# Patient Record
Sex: Female | Born: 1990 | Race: Black or African American | Hispanic: No | Marital: Married | Smoking: Current every day smoker
Health system: Southern US, Community
[De-identification: ages and names within clinical notes are randomized; demographics above are authoritative.]

## PROBLEM LIST (undated history)

## (undated) DIAGNOSIS — R87619 Unspecified abnormal cytological findings in specimens from cervix uteri: Secondary | ICD-10-CM

## (undated) DIAGNOSIS — A749 Chlamydial infection, unspecified: Secondary | ICD-10-CM

## (undated) DIAGNOSIS — R51 Headache: Secondary | ICD-10-CM

## (undated) DIAGNOSIS — B009 Herpesviral infection, unspecified: Secondary | ICD-10-CM

## (undated) DIAGNOSIS — K649 Unspecified hemorrhoids: Secondary | ICD-10-CM

## (undated) DIAGNOSIS — IMO0002 Reserved for concepts with insufficient information to code with codable children: Secondary | ICD-10-CM

## (undated) HISTORY — DX: Unspecified abnormal cytological findings in specimens from cervix uteri: R87.619

## (undated) HISTORY — DX: Unspecified hemorrhoids: K64.9

## (undated) HISTORY — PX: CHOLECYSTECTOMY: SHX55

## (undated) HISTORY — DX: Chlamydial infection, unspecified: A74.9

## (undated) HISTORY — PX: DILATION AND CURETTAGE OF UTERUS: SHX78

## (undated) HISTORY — DX: Reserved for concepts with insufficient information to code with codable children: IMO0002

## (undated) HISTORY — DX: Headache: R51

## (undated) HISTORY — DX: Herpesviral infection, unspecified: B00.9

---
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
## 2008-02-29 ENCOUNTER — Emergency Department (HOSPITAL_COMMUNITY): Admission: EM | Admit: 2008-02-29 | Discharge: 2008-02-29 | Payer: Self-pay | Admitting: Emergency Medicine

## 2008-06-09 ENCOUNTER — Emergency Department (HOSPITAL_COMMUNITY): Admission: EM | Admit: 2008-06-09 | Discharge: 2008-06-09 | Payer: Self-pay | Admitting: Emergency Medicine

## 2008-07-22 ENCOUNTER — Emergency Department (HOSPITAL_COMMUNITY): Admission: EM | Admit: 2008-07-22 | Discharge: 2008-07-23 | Payer: Self-pay | Admitting: Emergency Medicine

## 2008-08-28 ENCOUNTER — Other Ambulatory Visit: Admission: RE | Admit: 2008-08-28 | Discharge: 2008-08-28 | Payer: Self-pay | Admitting: Obstetrics and Gynecology

## 2008-08-31 ENCOUNTER — Emergency Department (HOSPITAL_COMMUNITY): Admission: EM | Admit: 2008-08-31 | Discharge: 2008-09-01 | Payer: Self-pay | Admitting: Emergency Medicine

## 2008-10-10 ENCOUNTER — Emergency Department (HOSPITAL_COMMUNITY): Admission: EM | Admit: 2008-10-10 | Discharge: 2008-10-10 | Payer: Self-pay | Admitting: Emergency Medicine

## 2008-10-11 ENCOUNTER — Emergency Department (HOSPITAL_COMMUNITY): Admission: EM | Admit: 2008-10-11 | Discharge: 2008-10-11 | Payer: Self-pay | Admitting: Family Medicine

## 2009-01-08 ENCOUNTER — Ambulatory Visit (HOSPITAL_COMMUNITY): Admission: RE | Admit: 2009-01-08 | Discharge: 2009-01-08 | Payer: Self-pay | Admitting: Obstetrics & Gynecology

## 2009-01-16 ENCOUNTER — Emergency Department (HOSPITAL_COMMUNITY): Admission: EM | Admit: 2009-01-16 | Discharge: 2009-01-16 | Payer: Self-pay | Admitting: Emergency Medicine

## 2009-01-22 ENCOUNTER — Ambulatory Visit (HOSPITAL_COMMUNITY): Admission: RE | Admit: 2009-01-22 | Discharge: 2009-01-22 | Payer: Self-pay | Admitting: Obstetrics & Gynecology

## 2009-02-23 ENCOUNTER — Inpatient Hospital Stay (HOSPITAL_COMMUNITY): Admission: AD | Admit: 2009-02-23 | Discharge: 2009-02-23 | Payer: Self-pay | Admitting: Obstetrics & Gynecology

## 2009-03-05 ENCOUNTER — Inpatient Hospital Stay (HOSPITAL_COMMUNITY): Admission: AD | Admit: 2009-03-05 | Discharge: 2009-03-07 | Payer: Self-pay | Admitting: Family Medicine

## 2009-03-05 ENCOUNTER — Ambulatory Visit: Payer: Self-pay | Admitting: Advanced Practice Midwife

## 2009-05-05 ENCOUNTER — Emergency Department (HOSPITAL_COMMUNITY): Admission: EM | Admit: 2009-05-05 | Discharge: 2009-05-05 | Payer: Self-pay | Admitting: Emergency Medicine

## 2009-06-30 ENCOUNTER — Emergency Department (HOSPITAL_COMMUNITY): Admission: EM | Admit: 2009-06-30 | Discharge: 2009-06-30 | Payer: Self-pay | Admitting: Emergency Medicine

## 2009-10-09 ENCOUNTER — Emergency Department (HOSPITAL_COMMUNITY): Admission: EM | Admit: 2009-10-09 | Discharge: 2009-10-09 | Payer: Self-pay | Admitting: Emergency Medicine

## 2009-10-25 ENCOUNTER — Inpatient Hospital Stay (HOSPITAL_COMMUNITY): Admission: EM | Admit: 2009-10-25 | Discharge: 2009-10-27 | Payer: Self-pay | Admitting: Emergency Medicine

## 2010-05-03 ENCOUNTER — Emergency Department (HOSPITAL_COMMUNITY): Admission: EM | Admit: 2010-05-03 | Discharge: 2010-05-04 | Payer: Self-pay | Admitting: Emergency Medicine

## 2010-08-09 ENCOUNTER — Emergency Department (HOSPITAL_COMMUNITY): Admission: EM | Admit: 2010-08-09 | Discharge: 2010-08-09 | Payer: Self-pay | Admitting: Emergency Medicine

## 2010-09-23 ENCOUNTER — Emergency Department (HOSPITAL_COMMUNITY): Admission: EM | Admit: 2010-09-23 | Discharge: 2010-09-23 | Payer: Self-pay | Admitting: Emergency Medicine

## 2010-11-01 ENCOUNTER — Emergency Department (HOSPITAL_COMMUNITY): Admission: EM | Admit: 2010-11-01 | Discharge: 2010-11-01 | Payer: Self-pay | Admitting: Emergency Medicine

## 2010-12-21 ENCOUNTER — Emergency Department (HOSPITAL_COMMUNITY)
Admission: EM | Admit: 2010-12-21 | Discharge: 2010-12-21 | Payer: Self-pay | Source: Home / Self Care | Admitting: Emergency Medicine

## 2011-01-28 ENCOUNTER — Emergency Department (HOSPITAL_COMMUNITY)
Admission: EM | Admit: 2011-01-28 | Discharge: 2011-01-28 | Disposition: A | Discharge status: Home / Self Care | Payer: Self-pay | Attending: Emergency Medicine | Admitting: Emergency Medicine

## 2011-01-28 DIAGNOSIS — F341 Dysthymic disorder: Secondary | ICD-10-CM | POA: Insufficient documentation

## 2011-01-28 DIAGNOSIS — R635 Abnormal weight gain: Secondary | ICD-10-CM | POA: Insufficient documentation

## 2011-01-28 DIAGNOSIS — R110 Nausea: Secondary | ICD-10-CM | POA: Insufficient documentation

## 2011-01-28 DIAGNOSIS — R42 Dizziness and giddiness: Secondary | ICD-10-CM | POA: Insufficient documentation

## 2011-01-28 LAB — GLUCOSE, CAPILLARY: Glucose-Capillary: 81 mg/dL (ref 70–99)

## 2011-01-28 LAB — POCT PREGNANCY, URINE: Preg Test, Ur: NEGATIVE

## 2011-03-08 LAB — URINALYSIS, ROUTINE W REFLEX MICROSCOPIC
Bilirubin Urine: NEGATIVE
Glucose, UA: NEGATIVE mg/dL
Hgb urine dipstick: NEGATIVE
Ketones, ur: NEGATIVE mg/dL
Nitrite: NEGATIVE
Protein, ur: NEGATIVE mg/dL
Specific Gravity, Urine: 1.02 (ref 1.005–1.030)
Urobilinogen, UA: 0.2 mg/dL (ref 0.0–1.0)
pH: 7 (ref 5.0–8.0)

## 2011-03-08 LAB — DIFFERENTIAL
Basophils Absolute: 0 10*3/uL (ref 0.0–0.1)
Basophils Relative: 1 % (ref 0–1)
Eosinophils Absolute: 0.5 10*3/uL (ref 0.0–0.7)
Eosinophils Relative: 7 % — ABNORMAL HIGH (ref 0–5)
Lymphocytes Relative: 38 % (ref 12–46)
Lymphs Abs: 2.6 10*3/uL (ref 0.7–4.0)
Monocytes Absolute: 0.5 10*3/uL (ref 0.1–1.0)
Monocytes Relative: 7 % (ref 3–12)
Neutro Abs: 3.3 10*3/uL (ref 1.7–7.7)
Neutrophils Relative %: 48 % (ref 43–77)

## 2011-03-08 LAB — CBC
HCT: 38.5 % (ref 36.0–46.0)
Hemoglobin: 13.1 g/dL (ref 12.0–15.0)
MCH: 29.6 pg (ref 26.0–34.0)
MCHC: 34.1 g/dL (ref 30.0–36.0)
MCV: 87 fL (ref 78.0–100.0)
Platelets: 272 10*3/uL (ref 150–400)
RBC: 4.42 MIL/uL (ref 3.87–5.11)
RDW: 14.8 % (ref 11.5–15.5)
WBC: 6.9 10*3/uL (ref 4.0–10.5)

## 2011-03-08 LAB — BASIC METABOLIC PANEL
BUN: 11 mg/dL (ref 6–23)
CO2: 23 mEq/L (ref 19–32)
Calcium: 8.6 mg/dL (ref 8.4–10.5)
Chloride: 110 mEq/L (ref 96–112)
Creatinine, Ser: 0.71 mg/dL (ref 0.4–1.2)
GFR calc Af Amer: >60 mL/min (ref >60)
GFR calc non Af Amer: >60 mL/min (ref >60)
Glucose, Bld: 75 mg/dL (ref 70–99)
Potassium: 3.9 mEq/L (ref 3.5–5.1)
Sodium: 137 mEq/L (ref 135–145)

## 2011-03-08 LAB — HEPATIC FUNCTION PANEL
ALT: 12 U/L (ref 0–35)
AST: 16 U/L (ref 0–37)
Albumin: 3.6 g/dL (ref 3.5–5.2)
Alkaline Phosphatase: 68 U/L (ref 39–117)
Bilirubin, Direct: 0.1 mg/dL (ref 0.0–0.3)
Indirect Bilirubin: 0.3 mg/dL (ref 0.3–0.9)
Total Bilirubin: 0.4 mg/dL (ref 0.3–1.2)
Total Protein: 6.9 g/dL (ref 6.0–8.3)

## 2011-03-08 LAB — LIPASE, BLOOD: Lipase: 24 U/L (ref 11–59)

## 2011-03-08 LAB — POCT PREGNANCY, URINE: Preg Test, Ur: NEGATIVE

## 2011-03-10 ENCOUNTER — Emergency Department (HOSPITAL_COMMUNITY)
Admission: EM | Admit: 2011-03-10 | Discharge: 2011-03-10 | Disposition: A | Discharge status: Home / Self Care | Payer: Medicaid Other | Attending: Emergency Medicine | Admitting: Emergency Medicine

## 2011-03-10 DIAGNOSIS — J069 Acute upper respiratory infection, unspecified: Secondary | ICD-10-CM | POA: Insufficient documentation

## 2011-03-10 LAB — RAPID STREP SCREEN (MED CTR MEBANE ONLY): Streptococcus, Group A Screen (Direct): NEGATIVE

## 2011-03-17 ENCOUNTER — Emergency Department (HOSPITAL_COMMUNITY)
Admission: EM | Admit: 2011-03-17 | Discharge: 2011-03-17 | Disposition: A | Discharge status: Home / Self Care | Payer: Medicaid Other | Attending: Emergency Medicine | Admitting: Emergency Medicine

## 2011-03-17 DIAGNOSIS — G8929 Other chronic pain: Secondary | ICD-10-CM | POA: Insufficient documentation

## 2011-03-17 DIAGNOSIS — E669 Obesity, unspecified: Secondary | ICD-10-CM | POA: Insufficient documentation

## 2011-03-17 DIAGNOSIS — Z0389 Encounter for observation for other suspected diseases and conditions ruled out: Secondary | ICD-10-CM | POA: Insufficient documentation

## 2011-03-23 ENCOUNTER — Emergency Department (HOSPITAL_COMMUNITY)
Admission: EM | Admit: 2011-03-23 | Discharge: 2011-03-23 | Disposition: A | Discharge status: Home / Self Care | Payer: Medicaid Other | Attending: Emergency Medicine | Admitting: Emergency Medicine

## 2011-03-23 DIAGNOSIS — F341 Dysthymic disorder: Secondary | ICD-10-CM | POA: Insufficient documentation

## 2011-03-23 DIAGNOSIS — L988 Other specified disorders of the skin and subcutaneous tissue: Secondary | ICD-10-CM | POA: Insufficient documentation

## 2011-03-23 DIAGNOSIS — L089 Local infection of the skin and subcutaneous tissue, unspecified: Secondary | ICD-10-CM | POA: Insufficient documentation

## 2011-03-23 LAB — POCT PREGNANCY, URINE: Preg Test, Ur: NEGATIVE

## 2011-03-30 LAB — BASIC METABOLIC PANEL
BUN: 5 mg/dL — ABNORMAL LOW (ref 6–23)
CO2: 26 mEq/L (ref 19–32)
Calcium: 8.7 mg/dL (ref 8.4–10.5)
Chloride: 111 mEq/L (ref 96–112)
Creatinine, Ser: 0.64 mg/dL (ref 0.4–1.2)
GFR calc Af Amer: >60 mL/min (ref >60)
GFR calc non Af Amer: >60 mL/min (ref >60)
Glucose, Bld: 92 mg/dL (ref 70–99)
Potassium: 4 mEq/L (ref 3.5–5.1)
Sodium: 141 mEq/L (ref 135–145)

## 2011-03-30 LAB — HEPATIC FUNCTION PANEL
AST: 15 U/L (ref 0–37)
Albumin: 3 g/dL — ABNORMAL LOW (ref 3.5–5.2)
Alkaline Phosphatase: 72 U/L (ref 39–117)
Bilirubin, Direct: 0.1 mg/dL (ref 0.0–0.3)
Total Bilirubin: 0.5 mg/dL (ref 0.3–1.2)

## 2011-03-30 LAB — DIFFERENTIAL
Basophils Relative: 1 % (ref 0–1)
Eosinophils Relative: 7 % — ABNORMAL HIGH (ref 0–5)
Monocytes Absolute: 0.3 10*3/uL (ref 0.1–1.0)
Monocytes Relative: 6 % (ref 3–12)
Neutro Abs: 1.6 10*3/uL — ABNORMAL LOW (ref 1.7–7.7)

## 2011-03-30 LAB — CBC
HCT: 33.5 % — ABNORMAL LOW (ref 36.0–46.0)
Hemoglobin: 11.3 g/dL — ABNORMAL LOW (ref 12.0–15.0)
MCHC: 33.7 g/dL (ref 30.0–36.0)
MCV: 85.7 fL (ref 78.0–100.0)
Platelets: 266 10*3/uL (ref 150–400)
RBC: 3.9 MIL/uL (ref 3.87–5.11)
RDW: 15.3 % (ref 11.5–15.5)
WBC: 4.8 10*3/uL (ref 4.0–10.5)

## 2011-03-31 LAB — ACETAMINOPHEN LEVEL: Acetaminophen (Tylenol), Serum: <10 ug/mL — ABNORMAL LOW (ref 10–30)

## 2011-03-31 LAB — BASIC METABOLIC PANEL
BUN: 5 mg/dL — ABNORMAL LOW (ref 6–23)
Calcium: 8.7 mg/dL (ref 8.4–10.5)
Creatinine, Ser: 0.72 mg/dL (ref 0.4–1.2)
GFR calc Af Amer: >60 mL/min (ref >60)
GFR calc non Af Amer: >60 mL/min (ref >60)

## 2011-03-31 LAB — URINALYSIS, ROUTINE W REFLEX MICROSCOPIC
Glucose, UA: NEGATIVE mg/dL
Hgb urine dipstick: NEGATIVE
Protein, ur: NEGATIVE mg/dL
Specific Gravity, Urine: 1.02 (ref 1.005–1.030)

## 2011-03-31 LAB — BLOOD GAS, ARTERIAL
Bicarbonate: 23.8 mEq/L (ref 20.0–24.0)
FIO2: 21 %
O2 Saturation: 96.2 %
O2 Saturation: 96.4 %
Patient temperature: 37
pCO2 arterial: 40.2 mmHg (ref 35.0–45.0)
pH, Arterial: 7.386 (ref 7.350–7.400)
pO2, Arterial: 84.4 mmHg (ref 80.0–100.0)

## 2011-03-31 LAB — PREGNANCY, URINE: Preg Test, Ur: NEGATIVE

## 2011-03-31 LAB — CBC
Platelets: 268 10*3/uL (ref 150–400)
WBC: 4.4 10*3/uL (ref 4.0–10.5)

## 2011-03-31 LAB — DIFFERENTIAL
Eosinophils Absolute: 0.2 10*3/uL (ref 0.0–0.7)
Lymphocytes Relative: 42 % (ref 12–46)
Lymphs Abs: 1.9 10*3/uL (ref 0.7–4.0)
Neutro Abs: 2.1 10*3/uL (ref 1.7–7.7)
Neutrophils Relative %: 48 % (ref 43–77)

## 2011-03-31 LAB — RAPID URINE DRUG SCREEN, HOSP PERFORMED
Amphetamines: NOT DETECTED
Benzodiazepines: POSITIVE — AB

## 2011-03-31 LAB — ETHANOL: Alcohol, Ethyl (B): <5 mg/dL (ref 0–10)

## 2011-03-31 LAB — SALICYLATE LEVEL: Salicylate Lvl: <4 mg/dL (ref 2.8–20.0)

## 2011-04-05 LAB — COMPREHENSIVE METABOLIC PANEL
ALT: 17 U/L (ref 0–35)
AST: 22 U/L (ref 0–37)
Calcium: 8.8 mg/dL (ref 8.4–10.5)
GFR calc Af Amer: >60 mL/min (ref >60)
Sodium: 140 mEq/L (ref 135–145)
Total Protein: 6.5 g/dL (ref 6.0–8.3)

## 2011-04-05 LAB — URINALYSIS, ROUTINE W REFLEX MICROSCOPIC
Nitrite: NEGATIVE
Specific Gravity, Urine: 1.02 (ref 1.005–1.030)
Urobilinogen, UA: 0.2 mg/dL (ref 0.0–1.0)

## 2011-04-05 LAB — D-DIMER, QUANTITATIVE: D-Dimer, Quant: 0.45 ug/mL-FEU (ref 0.00–0.48)

## 2011-04-05 LAB — PREGNANCY, URINE: Preg Test, Ur: NEGATIVE

## 2011-04-05 LAB — LIPASE, BLOOD: Lipase: 19 U/L (ref 11–59)

## 2011-04-07 LAB — URINALYSIS, DIPSTICK ONLY
Hgb urine dipstick: NEGATIVE
Leukocytes, UA: NEGATIVE
Nitrite: NEGATIVE
Specific Gravity, Urine: 1.02 (ref 1.005–1.030)
Urobilinogen, UA: 1 mg/dL (ref 0.0–1.0)

## 2011-04-07 LAB — CBC
HCT: 33.2 % — ABNORMAL LOW (ref 36.0–46.0)
Hemoglobin: 11 g/dL — ABNORMAL LOW (ref 12.0–15.0)
MCHC: 33.2 g/dL (ref 30.0–36.0)
RDW: 15.8 % — ABNORMAL HIGH (ref 11.5–15.5)

## 2011-04-07 LAB — COMPREHENSIVE METABOLIC PANEL
ALT: 12 U/L (ref 0–35)
CO2: 21 mEq/L (ref 19–32)
Calcium: 9.2 mg/dL (ref 8.4–10.5)
Chloride: 108 mEq/L (ref 96–112)
GFR calc non Af Amer: >60 mL/min (ref >60)
Glucose, Bld: 77 mg/dL (ref 70–99)
Sodium: 138 mEq/L (ref 135–145)
Total Bilirubin: 0.7 mg/dL (ref 0.3–1.2)

## 2011-04-11 LAB — URINALYSIS, ROUTINE W REFLEX MICROSCOPIC
Glucose, UA: NEGATIVE mg/dL
Ketones, ur: NEGATIVE mg/dL
Nitrite: NEGATIVE
Protein, ur: NEGATIVE mg/dL
Urobilinogen, UA: 0.2 mg/dL (ref 0.0–1.0)

## 2011-04-11 LAB — URINE MICROSCOPIC-ADD ON

## 2011-05-08 ENCOUNTER — Emergency Department (HOSPITAL_COMMUNITY): Payer: Medicaid Other

## 2011-05-08 ENCOUNTER — Inpatient Hospital Stay (HOSPITAL_COMMUNITY)
Admission: AD | Admit: 2011-05-08 | Discharge: 2011-05-09 | DRG: 312 | Disposition: A | Discharge status: Home / Self Care | Payer: Medicaid Other | Source: Ambulatory Visit | Attending: Internal Medicine | Admitting: Internal Medicine

## 2011-05-08 DIAGNOSIS — Z820 Family history of epilepsy and other diseases of the nervous system: Secondary | ICD-10-CM

## 2011-05-08 DIAGNOSIS — F329 Major depressive disorder, single episode, unspecified: Secondary | ICD-10-CM | POA: Diagnosis present

## 2011-05-08 DIAGNOSIS — R0789 Other chest pain: Secondary | ICD-10-CM | POA: Diagnosis present

## 2011-05-08 DIAGNOSIS — F259 Schizoaffective disorder, unspecified: Secondary | ICD-10-CM | POA: Diagnosis present

## 2011-05-08 DIAGNOSIS — F458 Other somatoform disorders: Secondary | ICD-10-CM | POA: Diagnosis present

## 2011-05-08 DIAGNOSIS — F3289 Other specified depressive episodes: Secondary | ICD-10-CM | POA: Diagnosis present

## 2011-05-08 DIAGNOSIS — F411 Generalized anxiety disorder: Secondary | ICD-10-CM | POA: Diagnosis present

## 2011-05-08 DIAGNOSIS — E876 Hypokalemia: Secondary | ICD-10-CM | POA: Diagnosis present

## 2011-05-08 DIAGNOSIS — R55 Syncope and collapse: Principal | ICD-10-CM | POA: Diagnosis present

## 2011-05-08 DIAGNOSIS — J0100 Acute maxillary sinusitis, unspecified: Secondary | ICD-10-CM | POA: Diagnosis present

## 2011-05-08 DIAGNOSIS — I498 Other specified cardiac arrhythmias: Secondary | ICD-10-CM | POA: Diagnosis present

## 2011-05-08 DIAGNOSIS — D649 Anemia, unspecified: Secondary | ICD-10-CM | POA: Diagnosis present

## 2011-05-08 LAB — RAPID URINE DRUG SCREEN, HOSP PERFORMED
Barbiturates: NOT DETECTED
Benzodiazepines: NOT DETECTED

## 2011-05-08 LAB — ETHANOL: Alcohol, Ethyl (B): <11 mg/dL — ABNORMAL HIGH (ref 0–10)

## 2011-05-08 LAB — CBC
MCH: 28 pg (ref 26.0–34.0)
MCHC: 31.9 g/dL (ref 30.0–36.0)
MCV: 87.8 fL (ref 78.0–100.0)
Platelets: 280 10*3/uL (ref 150–400)
RBC: 4.35 MIL/uL (ref 3.87–5.11)
RDW: 14.3 % (ref 11.5–15.5)

## 2011-05-08 LAB — URINALYSIS, ROUTINE W REFLEX MICROSCOPIC
Bilirubin Urine: NEGATIVE
Glucose, UA: NEGATIVE mg/dL
Ketones, ur: NEGATIVE mg/dL
Nitrite: NEGATIVE
Specific Gravity, Urine: >1.03 — ABNORMAL HIGH (ref 1.005–1.030)
pH: 6 (ref 5.0–8.0)

## 2011-05-08 LAB — DIFFERENTIAL
Blasts: 0 %
Eosinophils Absolute: 0.3 10*3/uL (ref 0.0–0.7)
Eosinophils Relative: 4 % (ref 0–5)
Lymphocytes Relative: 49 % — ABNORMAL HIGH (ref 12–46)
Lymphs Abs: 3.9 10*3/uL (ref 0.7–4.0)
Metamyelocytes Relative: 0 %
Monocytes Absolute: 0.6 10*3/uL (ref 0.1–1.0)
Monocytes Relative: 7 % (ref 3–12)
nRBC: 0 /100 WBC

## 2011-05-08 LAB — D-DIMER, QUANTITATIVE: D-Dimer, Quant: 0.3 ug/mL-FEU (ref 0.00–0.48)

## 2011-05-08 LAB — BASIC METABOLIC PANEL
CO2: 26 mEq/L (ref 19–32)
Chloride: 104 mEq/L (ref 96–112)
Creatinine, Ser: 0.64 mg/dL (ref 0.4–1.2)
GFR calc Af Amer: >60 mL/min (ref >60)
Potassium: 3.2 mEq/L — ABNORMAL LOW (ref 3.5–5.1)
Sodium: 138 mEq/L (ref 135–145)

## 2011-05-08 LAB — MAGNESIUM: Magnesium: 2.1 mg/dL (ref 1.5–2.5)

## 2011-05-09 ENCOUNTER — Inpatient Hospital Stay (HOSPITAL_COMMUNITY): Payer: Medicaid Other

## 2011-05-09 ENCOUNTER — Inpatient Hospital Stay (HOSPITAL_COMMUNITY): Admit: 2011-05-09 | Discharge: 2011-05-09 | Disposition: A | Discharge status: Home / Self Care | Payer: Medicaid Other

## 2011-05-09 DIAGNOSIS — R072 Precordial pain: Secondary | ICD-10-CM

## 2011-05-09 LAB — COMPREHENSIVE METABOLIC PANEL
ALT: 10 U/L (ref 0–35)
Alkaline Phosphatase: 72 U/L (ref 39–117)
BUN: 6 mg/dL (ref 6–23)
CO2: 27 mEq/L (ref 19–32)
Calcium: 9.4 mg/dL (ref 8.4–10.5)
GFR calc non Af Amer: >60 mL/min (ref >60)
Glucose, Bld: 96 mg/dL (ref 70–99)
Sodium: 135 mEq/L (ref 135–145)
Total Protein: 7.3 g/dL (ref 6.0–8.3)

## 2011-05-09 LAB — CARDIAC PANEL(CRET KIN+CKTOT+MB+TROPI)
CK, MB: 1.3 ng/mL (ref 0.3–4.0)
CK, MB: 1.1 ng/mL (ref 0.3–4.0)
Relative Index: 0.8 (ref 0.0–2.5)
Relative Index: 0.7 (ref 0.0–2.5)
Total CK: 154 U/L (ref 7–177)
Troponin I: <0.3 ng/mL (ref <0.30)

## 2011-05-09 LAB — CBC
Platelets: 292 10*3/uL (ref 150–400)
RBC: 4.2 MIL/uL (ref 3.87–5.11)
RDW: 14.4 % (ref 11.5–15.5)
WBC: 7.1 10*3/uL (ref 4.0–10.5)

## 2011-05-09 LAB — DIFFERENTIAL
Basophils Absolute: 0 10*3/uL (ref 0.0–0.1)
Eosinophils Absolute: 0.2 10*3/uL (ref 0.0–0.7)
Eosinophils Relative: 3 % (ref 0–5)
Lymphocytes Relative: 47 % — ABNORMAL HIGH (ref 12–46)
Neutrophils Relative %: 43 % (ref 43–77)

## 2011-05-09 LAB — MAGNESIUM: Magnesium: 2.1 mg/dL (ref 1.5–2.5)

## 2011-05-10 NOTE — Discharge Summary (Signed)
Melinda Valenzuela         ACCOUNT NO.:  [id number]  MEDICAL RECORD NO.:  [id number]           PATIENT TYPE:  I  LOCATION:  A222                          FACILITY:  [facility]  PHYSICIAN:  Elliot Cousin, M.D.    DATE OF BIRTH:  1991/05/06  DATE OF ADMISSION:  05/08/2011 DATE OF DISCHARGE:  05/14/2012LH                              DISCHARGE SUMMARY   DISCHARGE DIAGNOSES: 1. Chest pain.  Myocardial infarction ruled out.  D-dimer was within     normal limits. 2. Syncope.  Etiology not clearly elucidated during the     hospitalization.  Evaluation was unremarkable. 3. Transient dysphonia, possibly psychogenic in origin, however, it     did resolve prior to discharge. 4. Asymtomatic sinusitis. 5. History of schizoaffective disorder. 6. History of depression. 7. History of anxiety disorder. 8. History of suicide attempts in the past. 9. Reported chronic pain syndrome. 10. Transient bradycardia which resolved. 11. Marijuana and tobacco use.  DISCHARGE MEDICATIONS:  Acetaminophen 325 mg 2 tablets every 4 hours as needed for pain.  CONSULTATIONS:  None.  PROCEDURES PERFORMED: 1. EEG on May 09, 2011.  The result was pending at the time of     hospital discharge. 2. Two-D echocardiogram on May 09, 2011.  The result was pending at     the time of hospital discharge. 3. Bilateral carotid duplex ultrasound on May 09, 2011.  The results     revealed a normal study. 4. MRI of the brain without contrast on May 09, 2011.  The results     revealed no acute findings.  HISTORY OF PRESENT ILLNESS:  The patient is a 20 year old woman with a past medical history significant for schizoaffective disorder and depression, who was in her usual state of health until 6 o'clock the evening before admission when she was sitting down according to her fiance and suddenly felt chest pain.  She became lightheaded.  She went outside to get some fresh air and then she apparently passed out.  There was  some conflicting reports as to what happened next.  According to part of the EMS report, the patient may have possibly had a seizure. However, her fiance did not witness any tonic-clonic seizure activity. She was reported to have been foaming at the mouth.  During the evaluation in the ED, she was noted to be hemodynamically stable and afebrile.  The CT scan of her head revealed no acute intracranial findings, however, there were findings of sinusitis in the right maxillary area.  Her chest x-ray revealed no acute disease.  Her lab data were significant for a urine drug screen that revealed THC, negative urine pregnancy test, negative blood alcohol level, and a serum potassium of 3.2.  She was admitted for further evaluation and management.  HOSPITAL COURSE:  The patient was started on maintenance IV fluids for empiric hydration.  She was repleted with potassium chloride in the IV fluids and orally.  For further evaluation, a number of studies were ordered.  Her D-dimer was within normal limits at 0.30.  Her blood magnesium was within normal limits at 2.1.  All of her cardiac enzymes were well within normal  limits.  Of note, her total creatine kinase was normal and not consistent with the patient having a seizure.  The MRI of her brain was relatively unremarkable with the exception of right maxillary sinus inflammation. The patient had no complaints of sinus pain, nasal congestion or otherwise.  She did have a mild headache which was treated with as- needed Tylenol.  Her carotid ultrasound revealed no carotid artery stenosis.  The results of her 2-D echocardiogram and EEG were pending at the time of hospital discharge.  TSH was ordered as well, but the result was also pending at the time of hospital discharge.  The patient was transiently bradycardic once when her heart rate decreased to 50 beats per minute during sleep.  Otherwise, her heart rate was within normal limits.  Her EKG  revealed no significant findings.  Her chest pain resolved.  She had no evidence of syncope during the hospital course.  Even though she has a reported history of schizoaffective disorder, depression, and anxiety, she had no evidence of exacerbations during the hospital course.  Her urine drug screen was positive for THC.  She was advised to stop smoking marijuana and tobacco.  The clinical social worker was consulted to discuss followup and/or referral for outpatient psychiatric treatment.  She was given the number to the [facility] for followup and chronic management of her psychosocial issues.  She appeared to be receptive to the referral and plans to make an appointment.  Apparently, she had a bad experience with [facility] [place].  The physical therapist was consulted.  She evaluated the patient and based on her findings, the patient had no physical therapy needs.  The patient was ambulating in the hallway and her room with no obvious abnormalities.  The patient was alert and oriented throughout the hospitalization.  She remained hemodynamically stable.  She was given 40 mEq of potassium chloride on the day of hospital discharge.  [facility] contact person was able to discuss community services with the patient. They will try to obtain a primary care physician for her.  She was receptive.     Elliot Cousin, M.D.     DF/MEDQ  D:  05/09/2011  T:  05/10/2011  Job:  [id number]  Electronically Signed by Elliot Cousin M.D. on 05/10/2011 12:48:39 PM

## 2011-05-10 NOTE — Procedures (Signed)
Melinda Valenzuela, Melinda Valenzuela         ACCOUNT NO.:  [id number]  MEDICAL RECORD NO.:  [id number]           PATIENT TYPE:  A  LOCATION:  EE                           FACILITY:  [facility]  PHYSICIAN:  Sunday Klos A. Gerilyn Pilgrim, M.D. DATE OF BIRTH:  17-Mar-1991  DATE OF PROCEDURE: DATE OF DISCHARGE:  05/09/2011                             EEG INTERPRETATION   HISTORY:  This is a 19-year lady who had a spell of what appears to be seizure.  MEDICATIONS:  Lovenox, albuterol, potassium.  ANALYSIS:  A 16 channel recording using standard 10/20 measurement is conducted for 20 minutes.  There is a well-formed posterior dominant rhythm of 9.5 to 10 Hz which attenuates with eye opening.  There is beta activity observed but in the frontal areas.  Awake and drowsy activities are recorded.  Photic stimulation and hypoventilation are carried out without abnormal changes in the background activity.  There is no focal or lateralized slowing.  There is no epileptiform activity observed.  IMPRESSION:  Normal recording of awake and drowsy states.     Simpson Paulos A. Gerilyn Pilgrim, M.D.     KAD/MEDQ  D:  05/10/2011  T:  05/10/2011  Job:  [id number]

## 2011-06-02 NOTE — H&P (Signed)
Melinda Valenzuela, Melinda Valenzuela         ACCOUNT NO.:  [id number]  MEDICAL RECORD NO.:  [id number]           PATIENT TYPE:  I  LOCATION:  A222                          FACILITY:  [facility]  PHYSICIAN:  Osvaldo Shipper, MD     DATE OF BIRTH:  1991/04/06  DATE OF ADMISSION:  05/08/2011 DATE OF DISCHARGE:  LH                             HISTORY & PHYSICAL   PRIMARY CARE PHYSICIAN:  She is unassigned.  ADMISSION DIAGNOSES: 1. Chest pain, etiology unclear. 2. Syncopal episode, possible seizure. 3. Dysphonia/difficulty speaking. 4. History of schizoaffective disorder and depression. 5. History of suicidal attempts in the past.  CHIEF COMPLAINT:  Passing out.  HISTORY OF PRESENT ILLNESS:  The patient is a 20 year old African American female who has a past medical history of schizoaffective disorder and depression who was in her usual state of health until about 6 o'clock this evening when she was sitting down according to her fianceand suddenly felt chest pain.  She felt lightheaded.  She went outside to get some fresh air and then apparently she passed out.  There is some conflicting report as to what happened next.  According to some of the EMS report, the patient had a seizure, but according to the boyfriend/fiance, she did not having any seizures.  She was, however, foaming at the mouth.  The patient currently has a headache, which she has since she is being here in the ED.  She said she has a chest pain every few weeks and has never had been properly evaluated for the same. She denied any nausea, vomiting.  She also has difficulty speaking, which she does not know why.  She also admits to some shortness of breath.  She has never had seizures in the past.  So, essentially of a very poor history in this patient.  MEDICATIONS AT HOME:  None.  ALLERGIES:  None.  PAST MEDICAL HISTORY:  Positive for: 1. Anxiety disorder. 2. Chronic pain. 3. Depression. 4. Schizoaffective disorder. 5.  Panic attacks. 6. Suicidal attempts.  SURGICAL HISTORY:  None.  SOCIAL HISTORY:  She lives in [place].  She works at [employer].  She smokes seven to eight cigarettes on a daily basis.  Occasional alcohol use and she apparently got upset when she was asked about illicit drug use by the ED physician.  FAMILY HISTORY:  Positive for high blood pressure and respiratory problems in the family.  REVIEW OF SYSTEMS:  GENERAL:  Positive for weakness.  HEENT:  As in HPI. CARDIOVASCULAR:  As in HPI.  RESPIRATORY:  As in HPI.  GI: Unremarkable.  GU:  Unremarkable.  NEUROLOGICAL:  As in HPI. PSYCHIATRIC:  Unremarkable.  Other systems reviewed and found to be negative.  PHYSICAL EXAMINATION:  VITAL SIGNS:  Temperature 98.6, blood pressure 126/70, heart rate 71, respiratory rate 12, saturation 99% on room air. GENERAL:  This is an obese, African American female in no distress. HEENT:  Head is normocephalic and atraumatic.  Pupils are equal reacting.  No pallor.  No icterus.  Oral mucous membrane is moist.  No oral lesions are noted.  No obvious pharyngeal erythema is present. NECK:  Soft and supple.  No thyromegaly is appreciated.  Occasional stridor is present, but is not consistent. CARDIOVASCULAR:  S1-S2 is normal and regular.  No S3-S4, rubs, murmurs, or bruit. ABDOMEN:  Soft, nontender, nondistended.  Bowel sounds are present.  No masses or organomegaly is appreciated. RESPIRATORY:  Lungs are clear to auscultation bilaterally with no wheezing, rales, or rhonchi. GU:  Deferred. MUSCULOSKELETAL:  Normal muscle mass and tone. NEUROLOGIC:  She is alert, has difficulty speaking, speaks in a whisper, occasionally has normal sounds, but does not have any focal cranial nerve deficits.  No other focal motor deficits are present. SKIN:  Does not reveal any rashes.  LABORATORY DATA:  Her CBC is unremarkable.  Her BMET showed a potassium 3.2, otherwise negative.  Urine pregnancy negative.  Urine  drug screen positive for THC.  Alcohol level less than 11.  UA shows specific gravity greater than 1.030, otherwise negative.  IMAGING STUDIES: 1. She had a chest x-ray, which was negative for any acute process. 2. CT of the head showed normal brain with findings suggestive of     acute sinusitis.  EKG was done which shows sinus rhythm at 77 with normal axis, intervals appear to be in the normal range.  No Q-waves.  No concerning ST changes.  There are T inversions in V1-V2.  This EKG was compared to one from 2011 and these findings are chronic and stable.  ASSESSMENT:  This is a 20 year old African American female who presents to the hospital after what seconds like a syncopal episode plus/minus seizure activity.  Currently, she is having difficulty speakING, almost has symptoms suggestive of dysphonia.  Chest pain history, etiology is unclear.  It looks like chronic.  Syncope, the etiology is unclear associated with chest pain, we need to make sure this is not a thromboembolic event.  She does not have any focal neurological deficits.  No arrhythmias that have been noted so far; however, these are all in the differential.  PLAN: 1. Syncope.  We will get a D-dimer.  We will admit her to telemetry,     rule out for acute coronary syndrome which is unlikely once again.     We will get an echocardiogram and carotid Doppler.  EEG will be     done. 2. Difficulty speaking.  We will get an MRI of the brain.  We will get     a Neurology consultation.  We will check a magnesium level, check     LFTs in the morning. 3. Acute sinusitis.  Based on CT, we will put her on Flonase nasal     spray. 4. Possible stridor, actually I think this is probably related to her     dysphonia.  We will give her racemic epinephrine nebulizer x1 and     see what her response is.  Since the patient was not eating anything     before all these episodes happened, so there is no concern about     any foreign  object or foreign body.  If she does not respond with     appropriate treatment, further evaluation may be necessary.  If all the above workup is negative, the patient may need psychiatric evaluation.  DVT prophylaxis will be given.  Further management decisions will depend on results of further testing and the patient's response to treatment.  Osvaldo Shipper, MD     GK/MEDQ  D:  05/08/2011  T:  05/08/2011  Job:  [id number]  cc:   Darleen Crocker A. Gerilyn Pilgrim, M.D.  Fax: [phone]  Electronically Signed by Osvaldo Shipper MD on 06/02/2011 10:19:12 PM

## 2011-06-03 NOTE — Consult Note (Signed)
Melinda Valenzuela, [place]         ACCOUNT NO.:  [id number]  MEDICAL RECORD NO.:  [id number]           PATIENT TYPE:  LOCATION:                                 FACILITY:  PHYSICIAN:  Octavius Shin A. Gerilyn Pilgrim, M.D. DATE OF BIRTH:  1991-06-01  DATE OF CONSULTATION: DATE OF DISCHARGE:                                CONSULTATION   REASON FOR CONSULTATION:  Syncopal episode.  HISTORY:  A 20 year old black female, who was in a friend's house when she developed chest discomfort.  This was not feeling right.  She went outside to get fresh air.  She became lightheaded, woozy, and subsequently passed out.  She came to her senses several minutes later while EMS came by.  The spell probably noticed by her fiance who does not report the patient having any seizures per se or shaking, but the patient clearly reports biting her tongue.  She did lose her hearing and she now complains of having headaches and muscle soreness afterwards. There is a strong family history of epilepsy where her brother having fits of epileptic seizures.  Also, mother who had a couple of seizures. The patient does not have any personal history of seizures herself.  ADMISSION MEDICATIONS:  None.  ALLERGIES:  None.  PAST MEDICAL HISTORY:  Positive for anxiety disorder, chronic pain syndrome, depression, schizoaffective disorder, panic disorder, and suicide attempts.  SURGICAL HISTORY:  None.  SOCIAL HISTORY:  Lives in [place].  She works at [employer].  Smokes about half a pack of cigarette per day.  Occasional drug use.  No illicit drug use.  FAMILY HISTORY:  Positive for hypertension, stroke, seizures, respiratory illnesses.  REVIEW OF SYSTEMS:  As stated in HPI, otherwise negative, and unchanged from Dr. Benjie Karvonen done yesterday.  PHYSICAL EVALUATION:  GENERAL:  A pleasant overweight lady, in no acute distress. VITAL SIGNS:  Temperature 98.2, pulse 50, respirations 20, blood pressure 101/64. HEENT EVALUATION:   Neck is supple.  I did not see any bites.  Today, she is reporting was a small one.  Head is normocephalic. ABDOMEN:  Soft. EXTREMITIES:  No significant edema. MENTATION:  She is awake and alert.  She is lucid and coherent.  Speech, language, and cognition intact. CRANIAL NERVE EVALUATION:  Pupils are 6 mm, but briskly reactive. Extraocular movements are intact.  Visual fields are full.  Facial muscle strength is symmetric.  Tongue is midline.  Shoulder shrug is normal. MOTOR EXAMINATION:  Normal tone, bulk, and strength throughout.  There is no pronator drift.  Coordination shows no past-pointing tremors, rigidity, or dysmetria.  Reflexes are present and normal throughout. Plantars are both downgoing.  Sensation is normal to light touch and temperature.  SUPPORTIVE DATA:  Head CT scan of brain shows acute maxillary sinusitis on the right, nothing acute intracranially.  CPK 154.  Sodium 138, potassium 3.2, chloride 104, CO2 26, BUN 8, creatinine 0.6, glucose 90, calcium 9.8.  WBC 8, hemoglobin 12, platelet count 280.  Urinalysis negative.  Urine drug screen negative.  Pregnancy test negative.  ASSESSMENT:  Likely a seizure, although, she did not have any clonic activities, possibly that she may have had some tonic activity along  with syncopal episodes.  Her symptoms clearly sounds like a seizure when that she would turned to have epilepsy, needs to be determined.  Workup has been started and includes a carotid Doppler, EEG, and MRI.  She is also on telemetry.  I agree with this workup and will follow the patient.  Thank you for this consultation.     Melinda Valenzuela A. Gerilyn Pilgrim, M.D.     KAD/MEDQ  D:  05/09/2011  T:  05/09/2011  Job:  [id number]  Electronically Signed by Beryle Beams M.D. on 06/03/2011 04:46:44 PM

## 2011-06-28 ENCOUNTER — Emergency Department (HOSPITAL_COMMUNITY)
Admission: EM | Admit: 2011-06-28 | Discharge: 2011-06-28 | Disposition: A | Discharge status: Home / Self Care | Payer: Medicaid Other | Attending: Emergency Medicine | Admitting: Emergency Medicine

## 2011-06-28 DIAGNOSIS — F329 Major depressive disorder, single episode, unspecified: Secondary | ICD-10-CM | POA: Insufficient documentation

## 2011-06-28 DIAGNOSIS — R112 Nausea with vomiting, unspecified: Secondary | ICD-10-CM | POA: Insufficient documentation

## 2011-06-28 DIAGNOSIS — F3289 Other specified depressive episodes: Secondary | ICD-10-CM | POA: Insufficient documentation

## 2011-06-28 DIAGNOSIS — F411 Generalized anxiety disorder: Secondary | ICD-10-CM | POA: Insufficient documentation

## 2011-06-28 DIAGNOSIS — R197 Diarrhea, unspecified: Secondary | ICD-10-CM | POA: Insufficient documentation

## 2011-06-28 LAB — URINALYSIS, ROUTINE W REFLEX MICROSCOPIC
Leukocytes, UA: NEGATIVE
Nitrite: NEGATIVE
Specific Gravity, Urine: >1.03 — ABNORMAL HIGH (ref 1.005–1.030)
pH: 6 (ref 5.0–8.0)

## 2011-06-28 LAB — PREGNANCY, URINE: Preg Test, Ur: NEGATIVE

## 2011-07-28 ENCOUNTER — Emergency Department (HOSPITAL_COMMUNITY)
Admission: EM | Admit: 2011-07-28 | Discharge: 2011-07-28 | Disposition: A | Discharge status: Home / Self Care | Payer: Medicaid Other | Attending: Emergency Medicine | Admitting: Emergency Medicine

## 2011-07-28 ENCOUNTER — Encounter: Payer: Self-pay | Admitting: *Deleted

## 2011-07-28 DIAGNOSIS — L309 Dermatitis, unspecified: Secondary | ICD-10-CM

## 2011-07-28 DIAGNOSIS — F17200 Nicotine dependence, unspecified, uncomplicated: Secondary | ICD-10-CM | POA: Insufficient documentation

## 2011-07-28 DIAGNOSIS — L259 Unspecified contact dermatitis, unspecified cause: Secondary | ICD-10-CM | POA: Insufficient documentation

## 2011-07-28 MED ORDER — TRIAMCINOLONE ACETONIDE 0.1 % EX CREA: TOPICAL_CREAM | Freq: Three times a day (TID) | CUTANEOUS | Status: AC

## 2011-07-28 NOTE — ED Notes (Signed)
Pt states that she does not wish to file Workman's compensation.

## 2011-07-28 NOTE — ED Notes (Signed)
Pt states that she was cleaning a toilet at work 2 nights ago and smacked herself in the face with the brush. States that she had a rash on her face the next day and now has a sore and swelling to chin and left jaw. Also c/o itching.

## 2011-07-28 NOTE — ED Notes (Signed)
Pt was cleaning toilet at work and brush grazed bottom of chin. Pt states she thinks cleaner had bleach in solution. Chin began to itch per pt and she cleaned off with water. Small area to chin noted that is irritated. Pt states she tries not to scratch area. Has been applying neosporin on area at home.

## 2011-07-28 NOTE — ED Provider Notes (Signed)
History     CSN: [id number] Arrival date & time: 07/28/2011  1:22 PM  Chief Complaint  Patient presents with  . Allergic Reaction   HPI Comments: Patient c/o single lesion to the left chin that started after she accidentally splashed a toilet bowl cleaner on her face.  She is unsure of th chemicals in the cleaner but thinks it may have contained bleach.  She rinsed her face soon after the incident occurred but noticed the lesion the next day.  Also c/o itching to that area.  She denies chemical exposure to her mouth, nose or other parts of her face.  Also denies burning sensation, drainage, difficulty swallowing or breathing.    Patient is a 20 y.o. female presenting with allergic reaction. The history is provided by the patient.  Allergic Reaction The primary symptoms are  rash. The primary symptoms do not include wheezing, shortness of breath, cough, abdominal pain, nausea, vomiting, dizziness, angioedema or urticaria. The current episode started 2 days ago. The problem has not changed since onset. The rash is associated with itching.  Associated with: chemical exposure. Significant symptoms also include itching. Significant symptoms that are not present include eye redness, flushing or rhinorrhea.    History reviewed. No pertinent past medical history.  History reviewed. No pertinent past surgical history.  History reviewed. No pertinent family history.  History  Substance Use Topics  . Smoking status: Current Everyday Smoker -- 0.5 packs/day  . Smokeless tobacco: Not on file  . Alcohol Use: Yes     occasionally liquor    OB History    Grav Para Term Preterm Abortions TAB SAB Ect Mult Living                  Review of Systems  Constitutional: Negative for fever, chills, appetite change and fatigue.  HENT: Negative for ear pain, congestion, sore throat, facial swelling, rhinorrhea, mouth sores, trouble swallowing, neck pain and neck stiffness.   Eyes: Negative for pain,  redness, itching and visual disturbance.  Respiratory: Negative for cough, choking, chest tightness, shortness of breath and wheezing.   Cardiovascular: Negative.   Gastrointestinal: Negative for nausea, vomiting and abdominal pain.  Musculoskeletal: Negative.   Skin: Positive for itching and rash. Negative for flushing.  Neurological: Negative for dizziness, weakness, numbness and headaches.  Hematological: Does not bruise/bleed easily.    Physical Exam  BP 136/86  Pulse 79  Temp(Src) 98.6 F (37 C) (Oral)  Resp 20  Ht 5\' 7"  (1.702 m)  Wt 236 lb (107.049 kg)  BMI 36.96 kg/m2  SpO2 100%  LMP 06/12/2011  Physical Exam  Nursing note and vitals reviewed. Constitutional: She is oriented to person, place, and time. She appears well-developed and well-nourished. No distress.  HENT:  Head: Normocephalic and atraumatic.    Right Ear: External ear normal.  Left Ear: External ear normal.  Mouth/Throat: Oropharynx is clear and moist.  Eyes: Pupils are equal, round, and reactive to light.  Neck: Normal range of motion. Neck supple. No thyromegaly present.  Cardiovascular: Normal rate, regular rhythm and normal heart sounds.   Pulmonary/Chest: Effort normal and breath sounds normal.  Musculoskeletal: She exhibits tenderness. She exhibits no edema.  Lymphadenopathy:    She has no cervical adenopathy.  Neurological: She is alert and oriented to person, place, and time. She exhibits normal muscle tone. Coordination normal.  Skin: Rash noted.  Psychiatric: She has a normal mood and affect.    ED Course  Procedures  MDM  Single papular lesion to the left chin.  No drainage, edema, induration or erythema.  No vesicles.        Melinda Valenzuela Ackert, [place] 07/28/11 1423

## 2011-07-29 NOTE — ED Provider Notes (Signed)
History/physical exam/procedure(s) were performed by non-physician practitioner and as supervising physician I was immediately available for consultation/collaboration. I have reviewed all notes and am in agreement with care and plan.   Hilario Quarry, MD 07/29/11 [phone]

## 2011-09-19 LAB — INFLUENZA A+B VIRUS AG-DIRECT(RAPID): Inflenza A Ag: POSITIVE — AB

## 2011-09-23 LAB — URINALYSIS, ROUTINE W REFLEX MICROSCOPIC
Glucose, UA: NEGATIVE
Hgb urine dipstick: NEGATIVE
Ketones, ur: NEGATIVE
Protein, ur: NEGATIVE
pH: 7.5

## 2011-09-23 LAB — STREP A DNA PROBE: Group A Strep Probe: NEGATIVE

## 2011-09-23 LAB — RAPID STREP SCREEN (MED CTR MEBANE ONLY): Streptococcus, Group A Screen (Direct): NEGATIVE

## 2011-09-26 LAB — CBC
Hemoglobin: 12.4
MCHC: 34.4
RDW: 14.9

## 2011-09-26 LAB — URINALYSIS, ROUTINE W REFLEX MICROSCOPIC
Glucose, UA: NEGATIVE
Glucose, UA: NEGATIVE
Hgb urine dipstick: NEGATIVE
Hgb urine dipstick: NEGATIVE
Ketones, ur: >80 — AB
Ketones, ur: NEGATIVE
Protein, ur: NEGATIVE
pH: 7
pH: 7

## 2011-09-26 LAB — DIFFERENTIAL
Basophils Relative: 1
Eosinophils Absolute: 0.2
Eosinophils Relative: 3
Lymphs Abs: 1.9
Monocytes Absolute: 0.4
Monocytes Relative: 5
Neutrophils Relative %: 67

## 2011-09-26 LAB — POCT I-STAT, CHEM 8
BUN: 4 — ABNORMAL LOW
Calcium, Ion: 1.18
Chloride: 106
Creatinine, Ser: 0.7
TCO2: 25

## 2011-09-26 LAB — COMPREHENSIVE METABOLIC PANEL
ALT: 45 — ABNORMAL HIGH
AST: 35
Albumin: 2.9 — ABNORMAL LOW
Alkaline Phosphatase: 58
Calcium: 9
GFR calc Af Amer: >60
Glucose, Bld: 101 — ABNORMAL HIGH
Potassium: 3.3 — ABNORMAL LOW
Sodium: 133 — ABNORMAL LOW
Total Protein: 6.1

## 2011-09-28 LAB — URINALYSIS, ROUTINE W REFLEX MICROSCOPIC
Nitrite: NEGATIVE
Specific Gravity, Urine: 1.025
Urobilinogen, UA: 0.2
pH: 6.5

## 2011-09-28 LAB — GC/CHLAMYDIA PROBE AMP, GENITAL
Chlamydia, DNA Probe: NEGATIVE
GC Probe Amp, Genital: NEGATIVE

## 2011-09-28 LAB — DIFFERENTIAL
Basophils Absolute: 0
Basophils Relative: 0
Eosinophils Absolute: 0.2
Eosinophils Relative: 2
Lymphocytes Relative: 27
Lymphs Abs: 2.5
Monocytes Absolute: 0.7
Monocytes Relative: 8
Neutro Abs: 5.8
Neutrophils Relative %: 63

## 2011-09-28 LAB — ABO/RH: ABO/RH(D): O POS

## 2011-09-28 LAB — CBC
HCT: 34.3 — ABNORMAL LOW
Hemoglobin: 11.8 — ABNORMAL LOW
MCHC: 34.5
MCV: 87.9
Platelets: 283
RBC: 3.9
RDW: 15.2
WBC: 9.3

## 2011-09-28 LAB — HCG, QUANTITATIVE, PREGNANCY

## 2011-09-28 LAB — SYPHILIS: RPR W/REFLEX TO RPR TITER AND TREPONEMAL ANTIBODIES, TRADITIONAL SCREENING AND DIAGNOSIS ALGORITHM: RPR Ser Ql: NONREACTIVE

## 2011-09-28 LAB — PREGNANCY, URINE: Preg Test, Ur: POSITIVE

## 2011-09-28 LAB — WET PREP, GENITAL
Trich, Wet Prep: NONE SEEN
Yeast Wet Prep HPF POC: NONE SEEN

## 2011-10-05 LAB — POCT PREGNANCY, URINE: Preg Test, Ur: NEGATIVE

## 2011-10-06 LAB — OB RESULTS CONSOLE RPR: RPR: NONREACTIVE

## 2011-10-27 ENCOUNTER — Other Ambulatory Visit: Payer: Self-pay | Admitting: Family Medicine

## 2011-10-27 ENCOUNTER — Other Ambulatory Visit (HOSPITAL_COMMUNITY)
Admission: RE | Admit: 2011-10-27 | Discharge: 2011-10-27 | Disposition: A | Discharge status: Home / Self Care | Payer: Medicaid Other | Source: Ambulatory Visit | Attending: Obstetrics and Gynecology | Admitting: Obstetrics and Gynecology

## 2011-10-27 DIAGNOSIS — Z113 Encounter for screening for infections with a predominantly sexual mode of transmission: Secondary | ICD-10-CM | POA: Insufficient documentation

## 2011-10-27 DIAGNOSIS — Z01419 Encounter for gynecological examination (general) (routine) without abnormal findings: Secondary | ICD-10-CM | POA: Insufficient documentation

## 2011-12-27 NOTE — L&D Delivery Note (Signed)
Delivery Note At 2:45 PM a viable and healthy female was delivered via Vaginal, Spontaneous Delivery (Presentation: direct Occiput Anterior).  APGAR: 8, 9; weight pending.   Placenta status: Intact, Spontaneous.  Cord: 3 vessels no complications.  Anesthesia: Epidural  Episiotomy: None Lacerations: None Est. Blood Loss (mL): 300  Mom to postpartum.  Baby to nursery-stable.  Bedelia Person, PA-S 05/22/2012, 3:04 PM

## 2011-12-27 NOTE — L&D Delivery Note (Signed)
I was present for this delivery and agree with the above student's note.  LEFTWICH-KIRBY, Maninder Deboer Certified Nurse-Midwife

## 2011-12-30 ENCOUNTER — Emergency Department (HOSPITAL_COMMUNITY): Payer: Medicaid Other

## 2011-12-30 ENCOUNTER — Other Ambulatory Visit: Payer: Self-pay

## 2011-12-30 ENCOUNTER — Encounter (HOSPITAL_COMMUNITY): Payer: Self-pay | Admitting: Emergency Medicine

## 2011-12-30 ENCOUNTER — Emergency Department (HOSPITAL_COMMUNITY)
Admission: EM | Admit: 2011-12-30 | Discharge: 2011-12-30 | Disposition: A | Discharge status: Home / Self Care | Payer: Medicaid Other | Attending: Emergency Medicine | Admitting: Emergency Medicine

## 2011-12-30 DIAGNOSIS — O26899 Other specified pregnancy related conditions, unspecified trimester: Secondary | ICD-10-CM

## 2011-12-30 DIAGNOSIS — F17200 Nicotine dependence, unspecified, uncomplicated: Secondary | ICD-10-CM | POA: Insufficient documentation

## 2011-12-30 DIAGNOSIS — B9689 Other specified bacterial agents as the cause of diseases classified elsewhere: Secondary | ICD-10-CM | POA: Insufficient documentation

## 2011-12-30 DIAGNOSIS — N760 Acute vaginitis: Secondary | ICD-10-CM | POA: Insufficient documentation

## 2011-12-30 DIAGNOSIS — O2390 Unspecified genitourinary tract infection in pregnancy, unspecified trimester: Secondary | ICD-10-CM | POA: Insufficient documentation

## 2011-12-30 DIAGNOSIS — R079 Chest pain, unspecified: Secondary | ICD-10-CM | POA: Insufficient documentation

## 2011-12-30 DIAGNOSIS — R1013 Epigastric pain: Secondary | ICD-10-CM | POA: Insufficient documentation

## 2011-12-30 DIAGNOSIS — O99891 Other specified diseases and conditions complicating pregnancy: Secondary | ICD-10-CM | POA: Insufficient documentation

## 2011-12-30 DIAGNOSIS — A499 Bacterial infection, unspecified: Secondary | ICD-10-CM | POA: Insufficient documentation

## 2011-12-30 LAB — CBC
HCT: 34.5 % — ABNORMAL LOW (ref 36.0–46.0)
Hemoglobin: 11.6 g/dL — ABNORMAL LOW (ref 12.0–15.0)
MCV: 88.9 fL (ref 78.0–100.0)
RBC: 3.88 MIL/uL (ref 3.87–5.11)
WBC: 8.6 10*3/uL (ref 4.0–10.5)

## 2011-12-30 LAB — COMPREHENSIVE METABOLIC PANEL
ALT: 11 U/L (ref 0–35)
BUN: 5 mg/dL — ABNORMAL LOW (ref 6–23)
CO2: 23 mEq/L (ref 19–32)
Calcium: 8.9 mg/dL (ref 8.4–10.5)
Creatinine, Ser: 0.41 mg/dL — ABNORMAL LOW (ref 0.50–1.10)
GFR calc Af Amer: >90 mL/min (ref >90)
GFR calc non Af Amer: >90 mL/min (ref >90)
Glucose, Bld: 97 mg/dL (ref 70–99)
Sodium: 135 mEq/L (ref 135–145)

## 2011-12-30 LAB — DIFFERENTIAL
Eosinophils Relative: 5 % (ref 0–5)
Lymphocytes Relative: 32 % (ref 12–46)
Lymphs Abs: 2.8 10*3/uL (ref 0.7–4.0)
Monocytes Absolute: 0.6 10*3/uL (ref 0.1–1.0)
Monocytes Relative: 7 % (ref 3–12)

## 2011-12-30 LAB — WET PREP, GENITAL

## 2011-12-30 MED ORDER — METRONIDAZOLE 500 MG PO TABS: 500.0000 mg | ORAL_TABLET | Freq: Two times a day (BID) | ORAL | Status: AC

## 2011-12-30 MED ORDER — SODIUM CHLORIDE 0.9 % IV BOLUS (SEPSIS)
500.0000 mL | Freq: Once | INTRAVENOUS | Status: AC
  Administered 2011-12-30: 500 mL via INTRAVENOUS

## 2011-12-30 NOTE — ED Notes (Signed)
Patient is 5 months pregnant and stated about 2030 yesterday she started having sharp pains in abdomen radiating into chest area.

## 2011-12-30 NOTE — ED Notes (Signed)
Additional note, fetal heart tones heard via doppler rlq at 154

## 2011-12-30 NOTE — ED Provider Notes (Signed)
History     CSN: [id number]  Arrival date & time 12/30/11  0353   First MD Initiated Contact with Patient 12/30/11 [phone]      Chief Complaint  Patient presents with  . Abdominal Pain    (Consider location/radiation/quality/duration/timing/severity/associated sxs/prior treatment) Patient is a 21 y.o. female presenting with abdominal pain. The history is provided by the patient.  Abdominal Pain The primary symptoms of the illness include abdominal pain and vaginal discharge. The primary symptoms of the illness do not include fever, fatigue or shortness of breath.  Symptoms associated with the illness do not include back pain.   patient developed upper abdominal/lower chest pain at about 8:30 last night. It is sharp and Radiates to the chest. No cough. No dyspnea. No nausea vomiting diarrhea. She is 5 months pregnant and has had prenatal care. She states this does not feel like her previous contractions. She states she has had some vaginal discharge. She still feels the baby moving. No hemoptysis.  Past Medical History  Diagnosis Date  . Pregnant state, incidental     History reviewed. No pertinent past surgical history.  History reviewed. No pertinent family history.  History  Substance Use Topics  . Smoking status: Current Everyday Smoker -- 0.5 packs/day  . Smokeless tobacco: Not on file  . Alcohol Use: No     occasionally liquor    OB History    Grav Para Term Preterm Abortions TAB SAB Ect Mult Living   1               Review of Systems  Constitutional: Negative for fever and fatigue.  Respiratory: Negative for shortness of breath.   Cardiovascular: Positive for chest pain.  Gastrointestinal: Positive for abdominal pain.  Genitourinary: Positive for vaginal discharge. Negative for vaginal pain.  Musculoskeletal: Negative for back pain.  Neurological: Negative for light-headedness and headaches.  Psychiatric/Behavioral: Negative for behavioral problems.     Allergies  Review of patient's allergies indicates no known allergies.  Home Medications   Current Outpatient Rx  Name Route Sig Dispense Refill  . METRONIDAZOLE 500 MG PO TABS Oral Take 1 tablet (500 mg total) by mouth 2 (two) times daily. 14 tablet 0  . TRIAMCINOLONE ACETONIDE 0.1 % EX CREA Topical Apply topically 3 (three) times daily. 15 g 0    BP 116/67  Pulse 68  Temp(Src) 98.3 F (36.8 C) (Oral)  Resp 18  Ht 5\' 7"  (1.702 m)  Wt 238 lb (107.956 kg)  BMI 37.28 kg/m2  SpO2 98%  LMP 06/12/2011  Physical Exam  Nursing note and vitals reviewed. Constitutional: She is oriented to person, place, and time. She appears well-developed and well-nourished.  HENT:  Head: Normocephalic and atraumatic.  Eyes: EOM are normal. Pupils are equal, round, and reactive to light.  Neck: Normal range of motion. Neck supple.  Cardiovascular: Normal rate, regular rhythm and normal heart sounds.   No murmur heard. Pulmonary/Chest: Effort normal and breath sounds normal. No respiratory distress. She has no wheezes. She has no rales.  Abdominal: Soft. Bowel sounds are normal. She exhibits mass. She exhibits no distension. There is no tenderness. There is no rebound and no guarding.       Gravid uterus up to umbilicus  Genitourinary:       Thick white vaginal discharge. Cervix is somewhat soft and closed or fingertip dilated.  Musculoskeletal: Normal range of motion.  Neurological: She is alert and oriented to person, place, and time. No cranial nerve deficit.  Skin: Skin is warm and dry.  Psychiatric: She has a normal mood and affect. Her speech is normal.    ED Course  Procedures (including critical care time)  Labs Reviewed  CBC - Abnormal; Notable for the following:    Hemoglobin 11.6 (*)    HCT 34.5 (*)    All other components within normal limits  COMPREHENSIVE METABOLIC PANEL - Abnormal; Notable for the following:    BUN 5 (*)    Creatinine, Ser 0.41 (*)    Albumin 2.6 (*)     Total Bilirubin 0.1 (*)    All other components within normal limits  WET PREP, GENITAL - Abnormal; Notable for the following:    Clue Cells, Wet Prep MANY (*)    WBC, Wet Prep HPF POC MANY (*)    All other components within normal limits  DIFFERENTIAL   Dg Chest 1 View  12/30/2011  *RADIOLOGY REPORT*  Clinical Data: Chest pain.  The patient is pregnant and was shielded for examination.  CHEST - 1 VIEW  Comparison: 05/08/2011  Findings: The heart size and pulmonary vascularity are normal. The lungs appear clear and expanded without focal air space disease or consolidation. No blunting of the costophrenic angles.  No pneumothorax.  No significant change since previous study.  IMPRESSION: No evidence of active pulmonary disease.  Original Report Authenticated By: Marlon Pel, M.D.     1. Abdominal pain complicating pregnancy   2. Bacterial vaginosis      Date: 12/30/2011  Rate: 73  Rhythm: normal sinus rhythm  QRS Axis: normal  Intervals: normal  ST/T Wave abnormalities: normal  Conduction Disutrbances:none  Narrative Interpretation:   Old EKG Reviewed: none available     MDM  Patient's had epigastric to chest pain since around 8:30. EKG is reassuring x-ray is reassuring. Is not a. Clinically to be pulmonary embolism. She states not feeling contractions. It is in her upper abdomen also. Minimal tenderness. LFTs are not elevated. Blood pressures not elevated. Pelvic exam showed bacterial vaginosis. She'll be treated and will follow with OB.        Juliet Rude. Rubin Payor, MD 12/30/11 1610

## 2011-12-30 NOTE — ED Notes (Signed)
Pt reports upper abd pain radiating to chest on both sides; greater on Right side. Also, reports nausea. Pain started at 2000 1/3,  Increasing this am, awoke pt from sleep

## 2011-12-30 NOTE — ED Notes (Signed)
Pt c/o upper abd pain radiating to her chest starting yesterday, pt denies any vag bleeding, d/c, reports pain does not feel like contractions, pt reports edd 24th, rapid response RN contacted and advises FHR via doppler ok at this time, EDP in to assess

## 2012-04-13 IMAGING — CT CT HEAD W/O CM
1 series · 16 of 30 positions shown, 20 images · non-contrast
Comparison: None

CLINICAL DATA: Seizure

CT HEAD WITHOUT CONTRAST
TECHNIQUE: Contiguous axial images were obtained from the base of
the skull through the vertex without contrast.

[Series 2: headtrauma 4.8 h37s · axial · 0.43mm/px · z∈[+113,+268]mm · 16 of 36 slices shown, 20 images]
[im 2/36  brain]
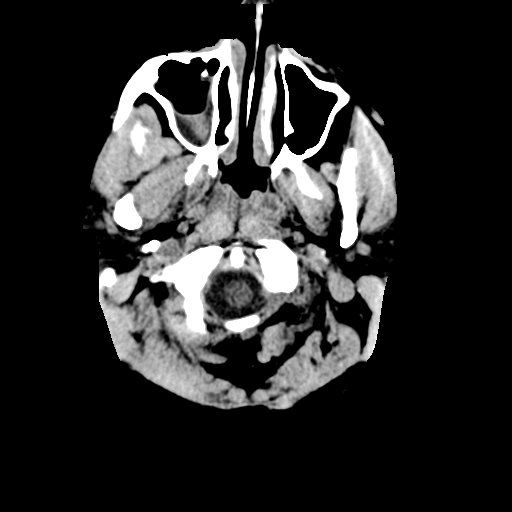
[im 2/36  bone]
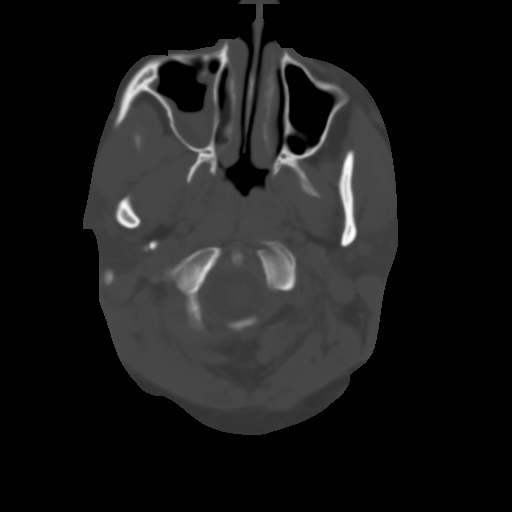
[im 4/36  brain]
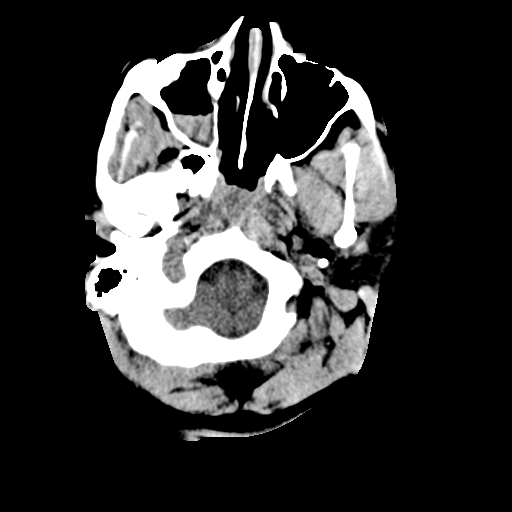
[im 7/36  brain]
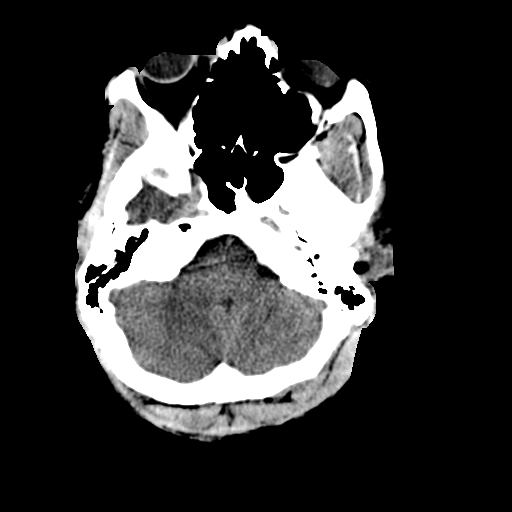
[im 9/36  brain]
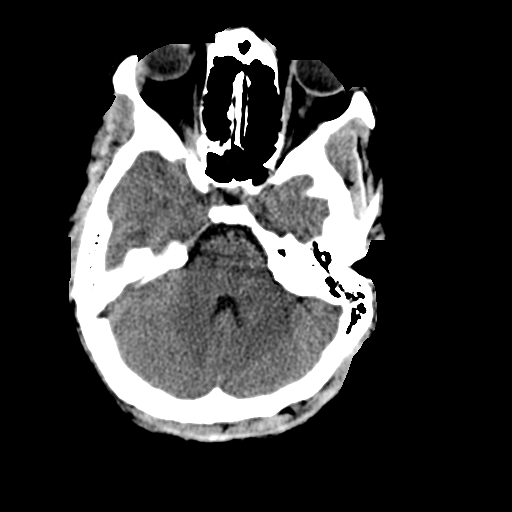
[im 10/36  brain]
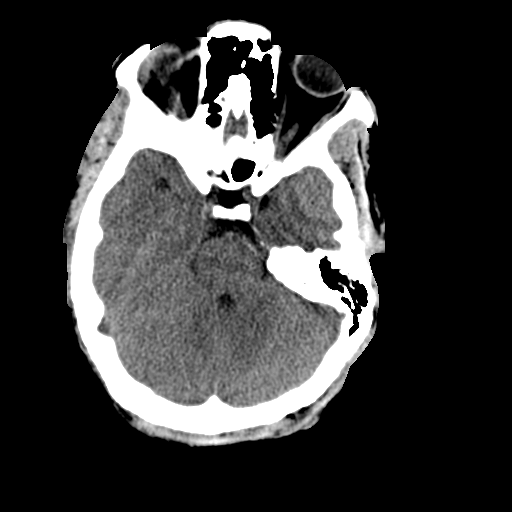
[im 10/36  bone]
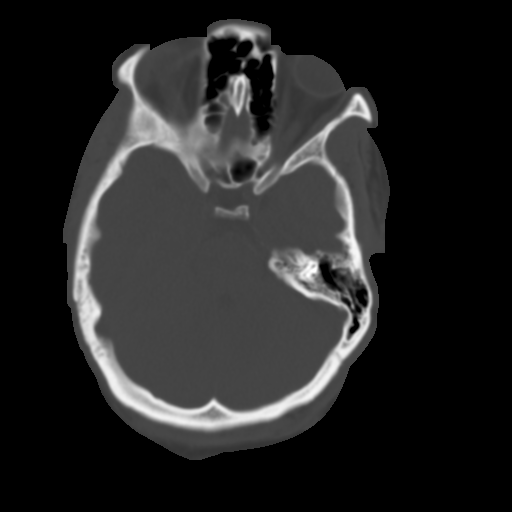
[im 13/36  brain]
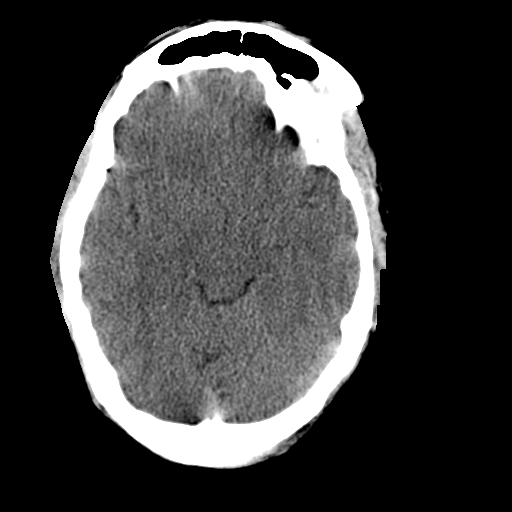
[im 15/36  brain]
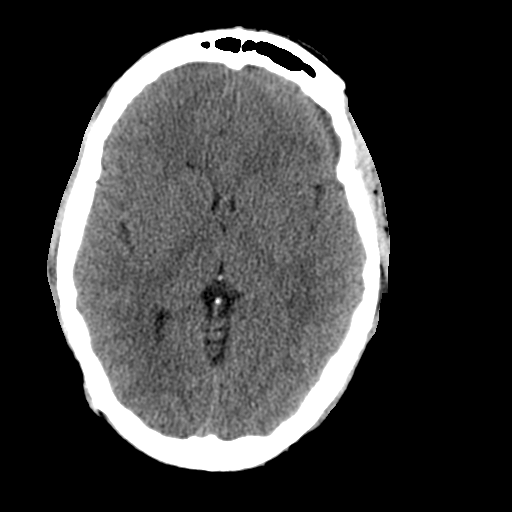
[im 17/36  brain]
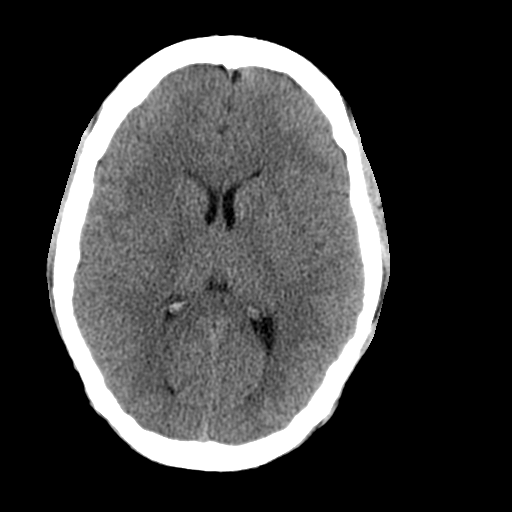
[im 19/36  brain]
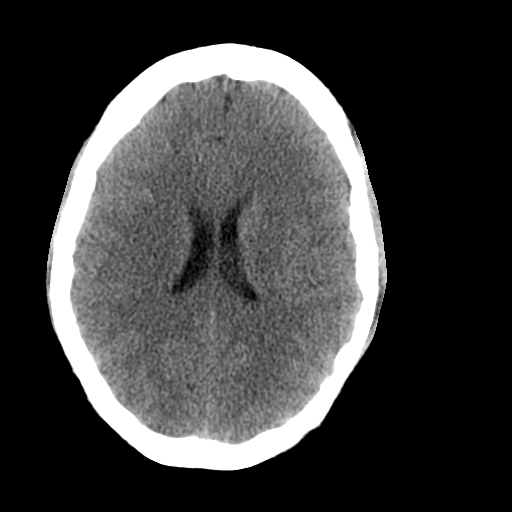
[im 19/36  bone]
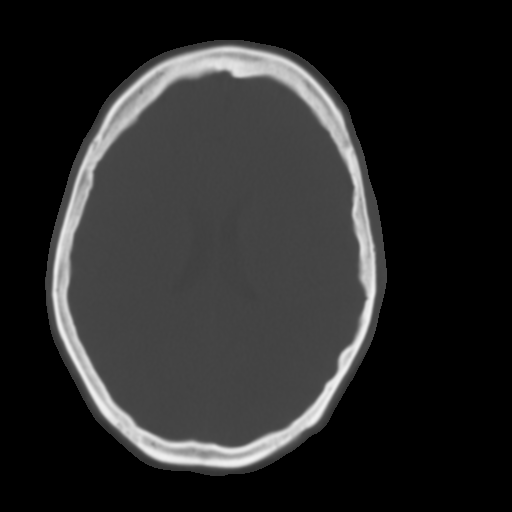
[im 21/36  brain]
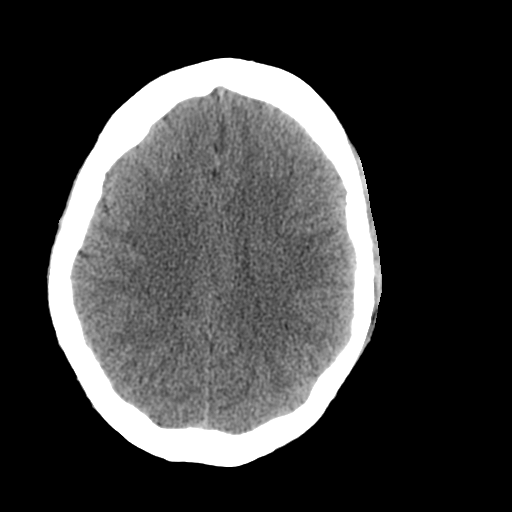
[im 23/36  brain]
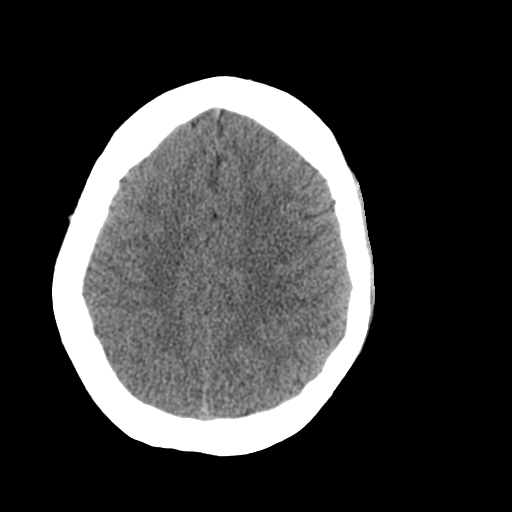
[im 26/36  brain]
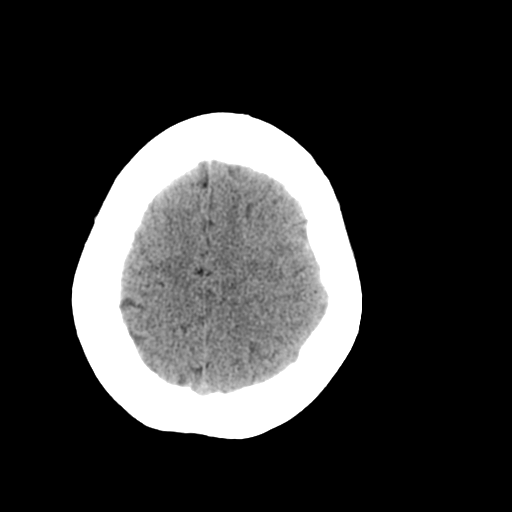
[im 27/36  brain]
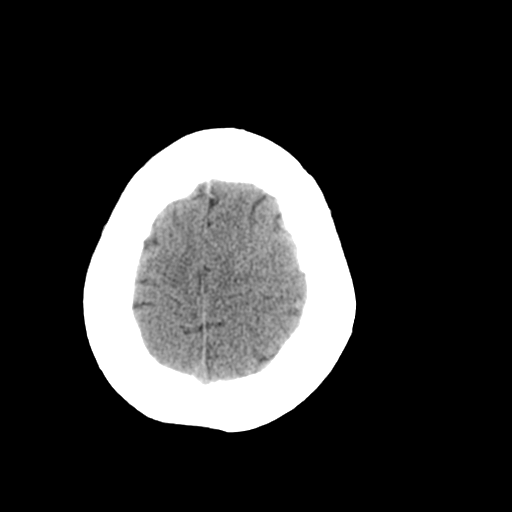
[im 27/36  bone]
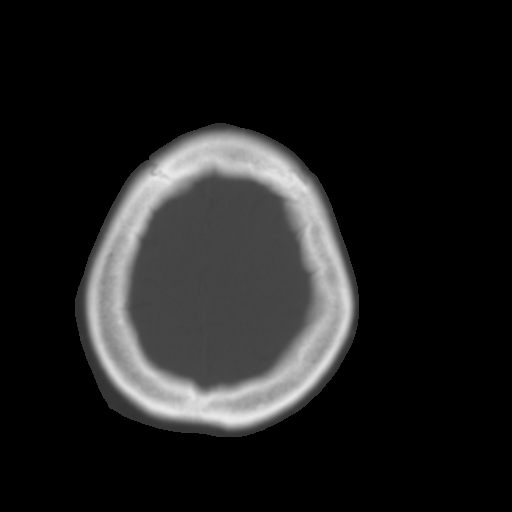
[im 29/36  brain]
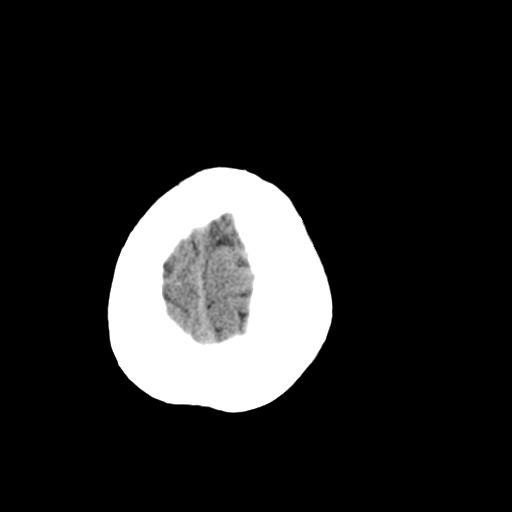
[im 32/36  brain]
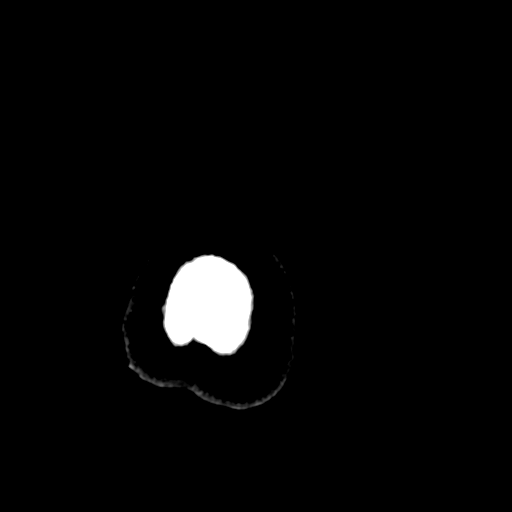
[im 34/36  brain]
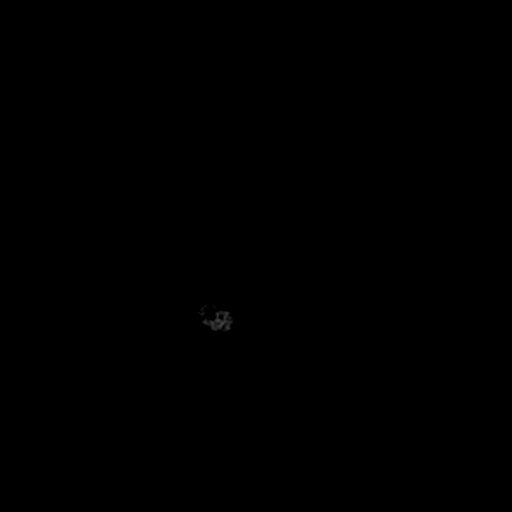

[16 of 30 positions shown; findings below may reference images not displayed]

FINDINGS: The brain appears normal.  No infarct mass or hemorrhage
is identified.  Ventricles are normal.

Air-fluid level on the right maxillary sinus with bubbly secretions
compatible with acute sinusitis.  Negative for skull fracture.
IMPRESSION: Normal brain

Acute sinusitis.

## 2012-05-20 ENCOUNTER — Inpatient Hospital Stay (HOSPITAL_COMMUNITY)
Admission: AD | Admit: 2012-05-20 | Discharge: 2012-05-20 | Disposition: A | Discharge status: Home / Self Care | Payer: Medicaid Other | Source: Ambulatory Visit | Attending: Obstetrics & Gynecology | Admitting: Obstetrics & Gynecology

## 2012-05-20 ENCOUNTER — Encounter (HOSPITAL_COMMUNITY): Payer: Self-pay | Admitting: *Deleted

## 2012-05-20 DIAGNOSIS — O479 False labor, unspecified: Secondary | ICD-10-CM | POA: Insufficient documentation

## 2012-05-20 MED ORDER — ZOLPIDEM TARTRATE 10 MG PO TABS
10.0000 mg | ORAL_TABLET | Freq: Once | ORAL | Status: AC
  Administered 2012-05-20: 10 mg via ORAL
  Filled 2012-05-20: qty 1

## 2012-05-20 NOTE — MAU Note (Signed)
Contractions x 2 hours, denies bleeding or ROM. Denies problems with pregnancy. Reports good fetal movement.

## 2012-05-20 NOTE — MAU Note (Signed)
Pt given Ambien to help sleep for d/c home. Pt's d/c instructions reviewed & pt voices understanding. Pt rates pain 8/10 which is unchanged from arrival.

## 2012-05-22 ENCOUNTER — Inpatient Hospital Stay (HOSPITAL_COMMUNITY)
Admission: AD | Admit: 2012-05-22 | Discharge: 2012-05-24 | DRG: 774 | Disposition: A | Discharge status: Home / Self Care | Payer: Medicaid Other | Source: Ambulatory Visit | Attending: Obstetrics and Gynecology | Admitting: Obstetrics and Gynecology

## 2012-05-22 ENCOUNTER — Encounter (HOSPITAL_COMMUNITY): Payer: Self-pay | Admitting: Anesthesiology

## 2012-05-22 ENCOUNTER — Encounter (HOSPITAL_COMMUNITY): Payer: Self-pay | Admitting: *Deleted

## 2012-05-22 ENCOUNTER — Inpatient Hospital Stay (HOSPITAL_COMMUNITY): Payer: Medicaid Other | Admitting: Anesthesiology

## 2012-05-22 DIAGNOSIS — Z3490 Encounter for supervision of normal pregnancy, unspecified, unspecified trimester: Secondary | ICD-10-CM

## 2012-05-22 LAB — RAPID URINE DRUG SCREEN, HOSP PERFORMED
Barbiturates: NOT DETECTED
Opiates: NOT DETECTED
Tetrahydrocannabinol: NOT DETECTED

## 2012-05-22 LAB — OB RESULTS CONSOLE RUBELLA ANTIBODY, IGM: Rubella: IMMUNE

## 2012-05-22 LAB — CBC
HCT: 35.9 % — ABNORMAL LOW (ref 36.0–46.0)
Hemoglobin: 11.6 g/dL — ABNORMAL LOW (ref 12.0–15.0)
MCHC: 32.3 g/dL (ref 30.0–36.0)
WBC: 11.4 10*3/uL — ABNORMAL HIGH (ref 4.0–10.5)

## 2012-05-22 LAB — RPR: RPR Ser Ql: NONREACTIVE

## 2012-05-22 LAB — OB RESULTS CONSOLE HIV ANTIBODY (ROUTINE TESTING): HIV: NONREACTIVE

## 2012-05-22 MED ORDER — EPHEDRINE 5 MG/ML INJ
10.0000 mg | INTRAVENOUS | Status: DC | PRN
  Filled 2012-05-22: qty 4

## 2012-05-22 MED ORDER — FENTANYL 2.5 MCG/ML BUPIVACAINE 1/10 % EPIDURAL INFUSION (WH - ANES)
14.0000 mL/h | INTRAMUSCULAR | Status: DC
  Administered 2012-05-22: 14 mL/h via EPIDURAL
  Filled 2012-05-22 (×2): qty 60

## 2012-05-22 MED ORDER — OXYTOCIN 20 UNITS IN LACTATED RINGERS INFUSION - SIMPLE
INTRAVENOUS | Status: AC
  Administered 2012-05-22: 125 mL/h via INTRAVENOUS
  Filled 2012-05-22: qty 1000

## 2012-05-22 MED ORDER — LACTATED RINGERS IV SOLN: 500.0000 mL | INTRAVENOUS | Status: DC | PRN

## 2012-05-22 MED ORDER — SIMETHICONE 80 MG PO CHEW: 80.0000 mg | CHEWABLE_TABLET | ORAL | Status: DC | PRN

## 2012-05-22 MED ORDER — FLEET ENEMA 7-19 GM/118ML RE ENEM: 1.0000 | ENEMA | RECTAL | Status: DC | PRN

## 2012-05-22 MED ORDER — LACTATED RINGERS IV SOLN
500.0000 mL | Freq: Once | INTRAVENOUS | Status: AC
  Administered 2012-05-22: 500 mL via INTRAVENOUS

## 2012-05-22 MED ORDER — WITCH HAZEL-GLYCERIN EX PADS: 1.0000 "application " | MEDICATED_PAD | CUTANEOUS | Status: DC | PRN

## 2012-05-22 MED ORDER — VALACYCLOVIR HCL 500 MG PO TABS
500.0000 mg | ORAL_TABLET | Freq: Two times a day (BID) | ORAL | Status: DC
  Administered 2012-05-22: 500 mg via ORAL
  Filled 2012-05-22 (×3): qty 1

## 2012-05-22 MED ORDER — DIBUCAINE 1 % RE OINT: 1.0000 "application " | TOPICAL_OINTMENT | RECTAL | Status: DC | PRN

## 2012-05-22 MED ORDER — ONDANSETRON HCL 4 MG/2ML IJ SOLN: 4.0000 mg | INTRAMUSCULAR | Status: DC | PRN

## 2012-05-22 MED ORDER — ONDANSETRON HCL 4 MG PO TABS: 4.0000 mg | ORAL_TABLET | ORAL | Status: DC | PRN

## 2012-05-22 MED ORDER — FENTANYL 2.5 MCG/ML BUPIVACAINE 1/10 % EPIDURAL INFUSION (WH - ANES)
INTRAMUSCULAR | Status: DC | PRN
  Administered 2012-05-22: 14 mL/h via EPIDURAL

## 2012-05-22 MED ORDER — IBUPROFEN 600 MG PO TABS: 600.0000 mg | ORAL_TABLET | Freq: Four times a day (QID) | ORAL | Status: DC | PRN

## 2012-05-22 MED ORDER — OXYTOCIN 20 UNITS IN LACTATED RINGERS INFUSION - SIMPLE: 125.0000 mL/h | Freq: Once | INTRAVENOUS | Status: DC

## 2012-05-22 MED ORDER — CITRIC ACID-SODIUM CITRATE 334-500 MG/5ML PO SOLN: 30.0000 mL | ORAL | Status: DC | PRN

## 2012-05-22 MED ORDER — MISOPROSTOL 200 MCG PO TABS
1000.0000 ug | ORAL_TABLET | Freq: Once | ORAL | Status: AC
  Administered 2012-05-22: 1000 ug via VAGINAL
  Filled 2012-05-22: qty 5

## 2012-05-22 MED ORDER — ZOLPIDEM TARTRATE 5 MG PO TABS: 5.0000 mg | ORAL_TABLET | Freq: Every evening | ORAL | Status: DC | PRN

## 2012-05-22 MED ORDER — PRENATAL MULTIVITAMIN CH
1.0000 | ORAL_TABLET | Freq: Every day | ORAL | Status: DC
  Administered 2012-05-23 – 2012-05-24 (×2): 1 via ORAL
  Filled 2012-05-22 (×2): qty 1

## 2012-05-22 MED ORDER — LANOLIN HYDROUS EX OINT: TOPICAL_OINTMENT | CUTANEOUS | Status: DC | PRN

## 2012-05-22 MED ORDER — PHENYLEPHRINE 40 MCG/ML (10ML) SYRINGE FOR IV PUSH (FOR BLOOD PRESSURE SUPPORT)
80.0000 ug | PREFILLED_SYRINGE | INTRAVENOUS | Status: DC | PRN
  Filled 2012-05-22: qty 5

## 2012-05-22 MED ORDER — ONDANSETRON HCL 4 MG/2ML IJ SOLN
4.0000 mg | Freq: Four times a day (QID) | INTRAMUSCULAR | Status: DC | PRN
  Administered 2012-05-22: 4 mg via INTRAVENOUS
  Filled 2012-05-22: qty 2

## 2012-05-22 MED ORDER — EPHEDRINE 5 MG/ML INJ: 10.0000 mg | INTRAVENOUS | Status: DC | PRN

## 2012-05-22 MED ORDER — NALBUPHINE SYRINGE 5 MG/0.5 ML: 5.0000 mg | INJECTION | INTRAMUSCULAR | Status: DC | PRN

## 2012-05-22 MED ORDER — OXYTOCIN 20 UNITS IN LACTATED RINGERS INFUSION - SIMPLE
125.0000 mL/h | INTRAVENOUS | Status: DC
  Administered 2012-05-22: 125 mL/h via INTRAVENOUS

## 2012-05-22 MED ORDER — TETANUS-DIPHTH-ACELL PERTUSSIS 5-2.5-18.5 LF-MCG/0.5 IM SUSP: 0.5000 mL | Freq: Once | INTRAMUSCULAR | Status: DC

## 2012-05-22 MED ORDER — ACETAMINOPHEN 325 MG PO TABS: 650.0000 mg | ORAL_TABLET | ORAL | Status: DC | PRN

## 2012-05-22 MED ORDER — PHENYLEPHRINE 40 MCG/ML (10ML) SYRINGE FOR IV PUSH (FOR BLOOD PRESSURE SUPPORT): 80.0000 ug | PREFILLED_SYRINGE | INTRAVENOUS | Status: DC | PRN

## 2012-05-22 MED ORDER — OXYTOCIN BOLUS FROM INFUSION
500.0000 mL | Freq: Once | INTRAVENOUS | Status: AC
  Administered 2012-05-22: 500 mL via INTRAVENOUS
  Filled 2012-05-22: qty 500
  Filled 2012-05-22: qty 1000

## 2012-05-22 MED ORDER — IBUPROFEN 600 MG PO TABS
600.0000 mg | ORAL_TABLET | Freq: Four times a day (QID) | ORAL | Status: DC
  Administered 2012-05-22 – 2012-05-24 (×8): 600 mg via ORAL
  Filled 2012-05-22 (×7): qty 1

## 2012-05-22 MED ORDER — BENZOCAINE-MENTHOL 20-0.5 % EX AERO: 1.0000 "application " | INHALATION_SPRAY | CUTANEOUS | Status: DC | PRN

## 2012-05-22 MED ORDER — LIDOCAINE HCL (PF) 1 % IJ SOLN
INTRAMUSCULAR | Status: DC | PRN
  Administered 2012-05-22 (×2): 8 mL

## 2012-05-22 MED ORDER — DIPHENHYDRAMINE HCL 50 MG/ML IJ SOLN: 12.5000 mg | INTRAMUSCULAR | Status: DC | PRN

## 2012-05-22 MED ORDER — SENNOSIDES-DOCUSATE SODIUM 8.6-50 MG PO TABS
2.0000 | ORAL_TABLET | Freq: Every day | ORAL | Status: DC
  Administered 2012-05-22 – 2012-05-23 (×2): 2 via ORAL

## 2012-05-22 MED ORDER — OXYCODONE-ACETAMINOPHEN 5-325 MG PO TABS
1.0000 | ORAL_TABLET | ORAL | Status: DC | PRN
  Administered 2012-05-22 – 2012-05-24 (×5): 1 via ORAL
  Filled 2012-05-22 (×5): qty 1

## 2012-05-22 MED ORDER — LACTATED RINGERS IV SOLN
INTRAVENOUS | Status: DC
  Administered 2012-05-22: 08:00:00 via INTRAVENOUS
  Administered 2012-05-22: 125 mL/h via INTRAVENOUS

## 2012-05-22 MED ORDER — OXYCODONE-ACETAMINOPHEN 5-325 MG PO TABS: 1.0000 | ORAL_TABLET | ORAL | Status: DC | PRN

## 2012-05-22 MED ORDER — LIDOCAINE HCL (PF) 1 % IJ SOLN: 30.0000 mL | INTRAMUSCULAR | Status: DC | PRN

## 2012-05-22 MED ORDER — DIPHENHYDRAMINE HCL 25 MG PO CAPS: 25.0000 mg | ORAL_CAPSULE | Freq: Four times a day (QID) | ORAL | Status: DC | PRN

## 2012-05-22 NOTE — H&P (Signed)
Melinda Valenzuela is a 21 y.o. female presenting for Onset of Labor with rupture of membranes while in MAU. Maternal Medical History:  Reason for admission: Reason for admission: rupture of membranes and contractions.  Reason for Admission:   nauseaContractions: Onset was 6-12 hours ago.   Frequency: regular.   Perceived severity is strong.    Fetal activity: Perceived fetal activity is normal.   Last perceived fetal movement was within the past hour.    Prenatal complications: No bleeding, cholelithiasis, HIV, hypertension, infection, IUGR, nephrolithiasis, oligohydramnios, placental abnormality, polyhydramnios, pre-eclampsia, preterm labor, substance abuse, thrombocytopenia or thrombophilia.   Prenatal Complications - Diabetes: none.    OB History    Grav Para Term Preterm Abortions TAB SAB Ect Mult Living   2 1 1       1      Past Medical History  Diagnosis Date  . Pregnant state, incidental    History reviewed. No pertinent past surgical history. Family History: family history is not on file. Social History:  reports that she has been smoking.  She does not have any smokeless tobacco history on file. She reports that she uses illicit drugs (Marijuana). She reports that she does not drink alcohol.  Review of Systems  Constitutional: Negative for fever and chills.  HENT: Negative for congestion.   Eyes: Negative for blurred vision and double vision.  Respiratory: Negative for cough and shortness of breath.   Cardiovascular: Negative for leg swelling.  Gastrointestinal: Negative for heartburn, nausea and vomiting.  Genitourinary: Negative for dysuria and urgency.  Musculoskeletal: Negative for myalgias.  Neurological: Negative for dizziness, tingling and headaches.  Psychiatric/Behavioral: Negative.     Dilation: 5 Effacement (%): 90 Station: -3 Exam by:: Ginger Morris RN Blood pressure 131/63, pulse 85, temperature 97.6 F (36.4 C), temperature source Oral, resp.  rate 20, height 5\' 6"  (1.676 m), weight 126.554 kg (279 lb), last menstrual period 08/12/2011, SpO2 99.00%. Maternal Exam:  Uterine Assessment: Contraction strength is firm.  Abdomen: Patient reports generalized tenderness.  Fundal height is appropriate for dates.   Fetal presentation: vertex  Introitus: Normal vulva. Normal vagina.    Fetal Exam Fetal Monitor Review: Baseline rate: 135.  Variability: moderate (6-25 bpm).   Pattern: accelerations present.    Fetal State Assessment: Category I - tracings are normal.     Physical Exam  Constitutional: She is oriented to person, place, and time. She appears well-developed and well-nourished. She appears distressed.       With contractions   HENT:  Head: Normocephalic and atraumatic.  Eyes: Right eye exhibits no discharge. Left eye exhibits no discharge. No scleral icterus.  Neck: Normal range of motion. Neck supple.  Cardiovascular: Normal rate, normal heart sounds and intact distal pulses.  Exam reveals no gallop and no friction rub.   No murmur heard. Respiratory: Effort normal and breath sounds normal. No respiratory distress. She has no wheezes. She has no rales. She exhibits no tenderness.  GI: She exhibits distension and mass. There is generalized tenderness. There is no rebound and no guarding.  Genitourinary: Vagina normal and uterus normal.  Neurological: She is alert and oriented to person, place, and time.  Skin: Skin is warm and dry. She is not diaphoretic.  Psychiatric: She has a normal mood and affect. Her behavior is normal. Judgment and thought content normal.  Dilation: 5 Effacement (%): 90 Cervical Position: Anterior Station: -3 Presentation: Vertex Exam by:: Ginger Morris RN    Prenatal labs: ABO, Rh:  O  positive Antibody:  neg Rubella:  immume RPR:   NR HBsAg:   Negative HIV:   Negative GBS:   Negative  Assessment/Plan: In active labor.  To L&D with expectant management. Epidural for pain  control. GBS negative   Andrena Mews, DO [facility] Resident - PGY-1 05/22/2012 9:12 AM  I have seen and examined this patient and I agree with the above. She is a pt of [facility]. Cam Hai 9:59 AM 05/22/2012

## 2012-05-22 NOTE — Anesthesia Procedure Notes (Signed)
Epidural Patient location during procedure: OB Start time: 05/22/2012 8:44 AM End time: 05/22/2012 8:47 AM Reason for block: procedure for pain  Staffing Anesthesiologist: Sandrea Hughs Performed by: anesthesiologist   Preanesthetic Checklist Completed: patient identified, site marked, surgical consent, pre-op evaluation, timeout performed, IV checked, risks and benefits discussed and monitors and equipment checked  Epidural Patient position: sitting Prep: site prepped and draped and DuraPrep Patient monitoring: continuous pulse ox and blood pressure Approach: midline Injection technique: LOR air  Needle:  Needle type: Tuohy  Needle gauge: 17 G Needle length: 9 cm Needle insertion depth: 7 cm Catheter type: closed end flexible Catheter size: 19 Gauge Catheter at skin depth: 12 cm Test dose: negative and Other  Assessment Sensory level: T8 Events: blood not aspirated, injection not painful, no injection resistance, negative IV test and no paresthesia

## 2012-05-22 NOTE — Progress Notes (Signed)
Dr Berline Chough in to evaluate pt. 5/90/-3, no membranes palpated. Pt had a large gush of fluid when standing up.

## 2012-05-22 NOTE — H&P (Signed)
Agree with above note.  Melinda Valenzuela 05/22/2012 10:28 AM

## 2012-05-22 NOTE — MAU Note (Signed)
Pt states she started having contractions at 430am. Denies any vaginal bleeding or leakage of fluid, States baby is active

## 2012-05-22 NOTE — Progress Notes (Signed)
Melinda Valenzuela is a 21 y.o. G2P1001 at [redacted]w[redacted]d   Subjective: Feeling some pressure; has epidural but it is not working as well as it used to  Objective: BP 130/70  Pulse 90  Temp(Src) 98.2 F (36.8 C) (Oral)  Resp 20  Ht 5\' 6"  (1.676 m)  Wt 126.554 kg (279 lb)  BMI 45.03 kg/m2  SpO2 99%  LMP 08/12/2011   Total I/O In: -  Out: 300 [Urine:300]  FHT:  FHR: 130 bpm, variability: moderate,  accelerations:  Present,  decelerations:  Absent UC:   regular, every 3-4 minutes SVE:   Dilation: Lip/rim Effacement (%): 100 Station: +1 Exam by:: Ginger Morris RN  Labs: Lab Results  Component Value Date   WBC 11.4* 05/22/2012   HGB 11.6* 05/22/2012   HCT 35.9* 05/22/2012   MCV 89.1 05/22/2012   PLT 359 05/22/2012    Assessment / Plan: IUP at term Active labor/transition  Will attempt to get comfortable with epidural PCA and then labor down Eval with increased pressure/urge to push  Cam Hai 05/22/2012, 12:28 PM

## 2012-05-22 NOTE — Anesthesia Preprocedure Evaluation (Signed)
Anesthesia Evaluation  Patient identified by MRN, date of birth, ID band Patient awake    Reviewed: Allergy & Precautions, H&P , NPO status , Patient's Chart, lab work & pertinent test results  Airway Mallampati: III TM Distance: >3 FB Neck ROM: full    Dental No notable dental hx.    Pulmonary neg pulmonary ROS,  breath sounds clear to auscultation  Pulmonary exam normal       Cardiovascular negative cardio ROS      Neuro/Psych negative neurological ROS  negative psych ROS   GI/Hepatic negative GI ROS, Neg liver ROS,   Endo/Other  Morbid obesity  Renal/GU negative Renal ROS  negative genitourinary   Musculoskeletal negative musculoskeletal ROS (+)   Abdominal (+) + obese,   Peds negative pediatric ROS (+)  Hematology negative hematology ROS (+)   Anesthesia Other Findings   Reproductive/Obstetrics (+) Pregnancy                           Anesthesia Physical Anesthesia Plan  ASA: III  Anesthesia Plan: Epidural   Post-op Pain Management:    Induction:   Airway Management Planned:   Additional Equipment:   Intra-op Plan:   Post-operative Plan:   Informed Consent: I have reviewed the patients History and Physical, chart, labs and discussed the procedure including the risks, benefits and alternatives for the proposed anesthesia with the patient or authorized representative who has indicated his/her understanding and acceptance.     Plan Discussed with:   Anesthesia Plan Comments:         Anesthesia Quick Evaluation  

## 2012-05-22 NOTE — Progress Notes (Signed)
efm tracing maternal when pt moves

## 2012-05-22 NOTE — Progress Notes (Signed)
PROGRESS NOTE:   Called to pt room for continued bleeding post-partum.  Per report pt having persistent bleeding and soaking pads q 15 minutes Uterus boggy.  500cc of Pitocin bolus ordered.    Upon exam uterus improved in tone, firm but 1/2 way between pubic bone and umbilicus.  Persistent vaginal bleeding flow from fundal massage.  This was significantly improved following Pitocin bolus per nurse.  Sterile Vaginal exam performed and no clots in introitus.    Cytotec PR inserted.  Pt to be closely monitored.  If persistent consider further exploration for retained products +   CBC in AM  Andrena Mews, DO [facility] Resident - PGY-1 05/22/2012 6:37 PM

## 2012-05-23 ENCOUNTER — Encounter (HOSPITAL_COMMUNITY): Payer: Self-pay | Admitting: *Deleted

## 2012-05-23 LAB — CBC
HCT: 28.4 % — ABNORMAL LOW (ref 36.0–46.0)
Hemoglobin: 9.3 g/dL — ABNORMAL LOW (ref 12.0–15.0)
MCH: 29.5 pg (ref 26.0–34.0)
MCHC: 32.7 g/dL (ref 30.0–36.0)

## 2012-05-23 MED ORDER — PRENATAL MULTIVITAMIN CH: 1.0000 | ORAL_TABLET | Freq: Every day | ORAL | Status: DC

## 2012-05-23 MED ORDER — IBUPROFEN 600 MG PO TABS: 600.0000 mg | ORAL_TABLET | Freq: Four times a day (QID) | ORAL | Status: AC | PRN

## 2012-05-23 NOTE — Anesthesia Postprocedure Evaluation (Signed)
  Anesthesia Post Note  Patient: Melinda Valenzuela  Procedure(s) Performed: * No procedures listed *  Anesthesia type: Epidural  Patient location: Mother/Baby  Post pain: Pain level controlled  Post assessment: Post-op Vital signs reviewed  Last Vitals:  Filed Vitals:   05/23/12 0518  BP: 118/74  Pulse: 82  Temp: 36.9 C  Resp: 20    Post vital signs: Reviewed  Level of consciousness:alert  Complications: No apparent anesthesia complications  Complications: No apparent anesthesia complications

## 2012-05-23 NOTE — Progress Notes (Signed)
UR chart review completed.  

## 2012-05-23 NOTE — Discharge Summary (Signed)
Obstetric Discharge Summary Reason for Admission: onset of labor Prenatal Procedures: none Intrapartum Procedures: spontaneous vaginal delivery Postpartum Procedures: Cytotec PR for heavy post partum bleeding - improved Complications-Operative and Postpartum: heavy pp bleeding - no overt hemorrhage Hemoglobin  Date Value Range Status  05/23/2012 9.3* 12.0-15.0 (g/dL) Final     REPEATED TO VERIFY     DELTA CHECK NOTED     HCT  Date Value Range Status  05/23/2012 28.4* 36.0-46.0 (%) Final    Physical Exam:  General: alert, cooperative and no distress Lochia: appropriate Uterine Fundus: firm Incision: n/a DVT Evaluation: No evidence of DVT seen on physical exam. Negative Homan's sign. No cords or calf tenderness. No significant calf/ankle edema.  Discharge Diagnoses: Term Pregnancy-delivered  Discharge Information: Date: 05/23/2012 Activity: pelvic rest Diet: routine Medications: Ibuprofen Condition: stable Instructions: refer to practice specific booklet Discharge to: home Follow-up Information    Please follow up. (in 6 weeks with your OB provider)          Newborn Data: Live born female  Birth Weight: 7 lb 12 oz (3515 g) APGAR: 8, 9  Home with mother  Andrena Mews, DO [facility] Resident - PGY-1 05/23/2012 9:07 AM  I have seen this patient and agree with the above resident's note.  LEFTWICH-KIRBY, Demareon Coldwell Certified Nurse-Midwife

## 2012-05-23 NOTE — Discharge Instructions (Signed)

## 2012-05-24 NOTE — Discharge Summary (Signed)
Obstetric Discharge Summary Reason for Admission: onset of labor Prenatal Procedures: none Intrapartum Procedures: spontaneous vaginal delivery Postpartum Procedures: none Complications-Operative and Postpartum: delayed PPH.  Patient reports some mild cramping this morning but pain is well tolerated.  Ambulating, voiding, and eating well.  Vaginal bleeding has greatly decreased.  Patient reports that the amount is less than her typical menstrual cycles.   Bottle feeding.  Plans to use Nexplanon for contraception.  Will f/u with [facility].   Hemoglobin  Date Value Range Status  05/23/2012 9.3* 12.0-15.0 (g/dL) Final     REPEATED TO VERIFY     DELTA CHECK NOTED     HCT  Date Value Range Status  05/23/2012 28.4* 36.0-46.0 (%) Final    Physical Exam:  General: alert, cooperative, appears stated age and no distress Lochia: appropriate Uterine Fundus: firm DVT Evaluation: No evidence of DVT seen on physical exam.  No calf tenderness.  No significant calf/ankle edema.  Discharge Diagnoses: Term Pregnancy-delivered  Discharge Information: Date: 05/24/2012 Activity: pelvic rest Diet: routine Medications: Ibuprofen Condition: stable Instructions: refer to practice specific booklet Discharge to: home Follow-up Information    Please follow up. (in 6 weeks with your OB provider)          Newborn Data: Live born female  Birth Weight: 7 lb 12 oz (3515 g) APGAR: 8, 9  Home with mother.  Bedelia Person, PA-S 05/24/2012, 7:48 AM  I have seen this patient and agree with the above PA student's note.  LEFTWICH-KIRBY, Mahaley Schwering Certified Nurse-Midwife

## 2012-05-24 NOTE — Discharge Summary (Signed)
Medical Screening exam and patient care preformed by advanced practice provider.  Agree with the above management.  

## 2012-08-03 ENCOUNTER — Emergency Department (HOSPITAL_COMMUNITY)
Admission: EM | Admit: 2012-08-03 | Discharge: 2012-08-03 | Disposition: A | Discharge status: Home / Self Care | Payer: Self-pay

## 2012-10-22 ENCOUNTER — Emergency Department (HOSPITAL_COMMUNITY)
Admission: EM | Admit: 2012-10-22 | Discharge: 2012-10-22 | Disposition: A | Discharge status: Home / Self Care | Payer: Medicaid Other | Attending: Emergency Medicine | Admitting: Emergency Medicine

## 2012-10-22 ENCOUNTER — Encounter (HOSPITAL_COMMUNITY): Payer: Self-pay

## 2012-10-22 DIAGNOSIS — R05 Cough: Secondary | ICD-10-CM | POA: Insufficient documentation

## 2012-10-22 DIAGNOSIS — Z3490 Encounter for supervision of normal pregnancy, unspecified, unspecified trimester: Secondary | ICD-10-CM

## 2012-10-22 DIAGNOSIS — F17200 Nicotine dependence, unspecified, uncomplicated: Secondary | ICD-10-CM | POA: Insufficient documentation

## 2012-10-22 DIAGNOSIS — J069 Acute upper respiratory infection, unspecified: Secondary | ICD-10-CM | POA: Insufficient documentation

## 2012-10-22 DIAGNOSIS — Z792 Long term (current) use of antibiotics: Secondary | ICD-10-CM | POA: Insufficient documentation

## 2012-10-22 DIAGNOSIS — R059 Cough, unspecified: Secondary | ICD-10-CM | POA: Insufficient documentation

## 2012-10-22 DIAGNOSIS — Z331 Pregnant state, incidental: Secondary | ICD-10-CM | POA: Insufficient documentation

## 2012-10-22 DIAGNOSIS — J329 Chronic sinusitis, unspecified: Secondary | ICD-10-CM | POA: Insufficient documentation

## 2012-10-22 LAB — BASIC METABOLIC PANEL
Calcium: 9.1 mg/dL (ref 8.4–10.5)
GFR calc non Af Amer: >90 mL/min (ref >90)
Glucose, Bld: 79 mg/dL (ref 70–99)
Sodium: 134 mEq/L — ABNORMAL LOW (ref 135–145)

## 2012-10-22 LAB — CBC WITH DIFFERENTIAL/PLATELET
Basophils Absolute: 0 10*3/uL (ref 0.0–0.1)
Eosinophils Absolute: 0.5 10*3/uL (ref 0.0–0.7)
Eosinophils Relative: 8 % — ABNORMAL HIGH (ref 0–5)
MCH: 28.6 pg (ref 26.0–34.0)
MCHC: 33.8 g/dL (ref 30.0–36.0)
MCV: 84.7 fL (ref 78.0–100.0)
Platelets: 280 10*3/uL (ref 150–400)
RDW: 15.7 % — ABNORMAL HIGH (ref 11.5–15.5)

## 2012-10-22 LAB — URINALYSIS, ROUTINE W REFLEX MICROSCOPIC
Hgb urine dipstick: NEGATIVE
Protein, ur: NEGATIVE mg/dL
Urobilinogen, UA: 0.2 mg/dL (ref 0.0–1.0)

## 2012-10-22 MED ORDER — AMOXICILLIN 500 MG PO CAPS: 500.0000 mg | ORAL_CAPSULE | Freq: Three times a day (TID) | ORAL | Status: DC

## 2012-10-22 NOTE — ED Notes (Signed)
C/o nasal congestion with intermittent dizziness x 3 days. [redacted] weeks pregnant. Denies any pregnany complications

## 2012-10-22 NOTE — ED Provider Notes (Signed)
History   This chart was scribed for Shelda Jakes, MD by Gerlean Ren. This patient was seen in room APA11/APA11 and the patient's care was started at 10:08.   CSN: [id number]  Arrival date & time 10/22/12  7829   First MD Initiated Contact with Patient 10/22/12 [phone]      Chief Complaint  Patient presents with  . Nasal Congestion    (Consider location/radiation/quality/duration/timing/severity/associated sxs/prior treatment) The history is provided by the patient. No language interpreter was used.   Melinda Valenzuela is a 21 y.o. female who is currently [redacted] weeks pregnant who presents to the Emergency Department complaining of 3 days of constant, non-improving dark green phlegm-producing cough with associated nasal congestion, sinus pain and pressure, and intermittent dizziness.  Pt denies any complications with the pregnancy thus far.  Pt denies nausea, emesis, diarrhea, abdominal pain, chest pain, back pain, vaginal symptoms, and urinary symptoms.  Pt reports having 2-3 sinus infections per year.  Pt denies any complications with pregnancy so far.  Pt has no h/o chronic medical conditions.     Past Medical History  Diagnosis Date  . Pregnant state, incidental     History reviewed. No pertinent past surgical history.  No family history on file.  History  Substance Use Topics  . Smoking status: Current Every Day Smoker -- 0.5 packs/day  . Smokeless tobacco: Not on file  . Alcohol Use: No     occasionally liquor    OB History    Grav Para Term Preterm Abortions TAB SAB Ect Mult Living   3 2 2  0 0 0 0 0 0 2      Review of Systems  Constitutional: Negative for fever.  HENT: Positive for congestion and sinus pressure. Negative for ear pain.   Eyes: Negative for visual disturbance.  Respiratory: Positive for cough.   Cardiovascular: Negative for chest pain.  Gastrointestinal: Negative for nausea, vomiting, abdominal pain and diarrhea.  Genitourinary: Negative for  dysuria, vaginal bleeding, vaginal discharge, difficulty urinating and vaginal pain.  Musculoskeletal: Negative for back pain.  Skin: Negative for rash.  Neurological: Positive for dizziness.  Psychiatric/Behavioral: Negative for confusion.    Allergies  Review of patient's allergies indicates no known allergies.  Home Medications   Current Outpatient Rx  Name Route Sig Dispense Refill  . AMOXICILLIN 500 MG PO CAPS Oral Take 1 capsule (500 mg total) by mouth 3 (three) times daily. 21 capsule 0    BP 134/66  Pulse 68  Resp 18  SpO2 99%  LMP 08/12/2011  Physical Exam  Nursing note and vitals reviewed. Constitutional: She is oriented to person, place, and time. She appears well-developed and well-nourished.  HENT:  Head: Normocephalic and atraumatic.  Mouth/Throat: Oropharynx is clear and moist.  Eyes: EOM are normal.  Neck: Normal range of motion. No tracheal deviation present.  Cardiovascular: Normal rate, regular rhythm and normal heart sounds.   No murmur heard. Pulmonary/Chest: Effort normal and breath sounds normal. She has no wheezes.  Abdominal: Soft. Bowel sounds are normal. There is no tenderness.  Musculoskeletal: Normal range of motion. She exhibits no edema.  Lymphadenopathy:    She has no cervical adenopathy.  Neurological: She is alert and oriented to person, place, and time. No cranial nerve deficit. Coordination normal.  Skin: Skin is warm.  Psychiatric: She has a normal mood and affect.    ED Course  Procedures (including critical care time) DIAGNOSTIC STUDIES: Oxygen Saturation is 99% on room air, normal by  my interpretation.    COORDINATION OF CARE: 10:14- Patient informed of clinical course, understands medical decision-making process, and agrees with plan.  Ordered urinalysis, CBC, and b-met.    Labs Reviewed  CBC WITH DIFFERENTIAL - Abnormal; Notable for the following:    HCT 35.5 (*)     RDW 15.7 (*)     Eosinophils Relative 8 (*)     All  other components within normal limits  BASIC METABOLIC PANEL - Abnormal; Notable for the following:    Sodium 134 (*)     Creatinine, Ser 0.49 (*)     All other components within normal limits  URINALYSIS, ROUTINE W REFLEX MICROSCOPIC   Results for orders placed during the hospital encounter of 10/22/12  CBC WITH DIFFERENTIAL      Component Value Range   WBC 5.6  4.0 - 10.5 K/uL   RBC 4.19  3.87 - 5.11 MIL/uL   Hemoglobin 12.0  12.0 - 15.0 g/dL   HCT 16.1 (*) 09.6 - 04.5 %   MCV 84.7  78.0 - 100.0 fL   MCH 28.6  26.0 - 34.0 pg   MCHC 33.8  30.0 - 36.0 g/dL   RDW 40.9 (*) 81.1 - 91.4 %   Platelets 280  150 - 400 K/uL   Neutrophils Relative 44  43 - 77 %   Neutro Abs 2.5  1.7 - 7.7 K/uL   Lymphocytes Relative 40  12 - 46 %   Lymphs Abs 2.2  0.7 - 4.0 K/uL   Monocytes Relative 7  3 - 12 %   Monocytes Absolute 0.4  0.1 - 1.0 K/uL   Eosinophils Relative 8 (*) 0 - 5 %   Eosinophils Absolute 0.5  0.0 - 0.7 K/uL   Basophils Relative 0  0 - 1 %   Basophils Absolute 0.0  0.0 - 0.1 K/uL  BASIC METABOLIC PANEL      Component Value Range   Sodium 134 (*) 135 - 145 mEq/L   Potassium 3.6  3.5 - 5.1 mEq/L   Chloride 102  96 - 112 mEq/L   CO2 23  19 - 32 mEq/L   Glucose, Bld 79  70 - 99 mg/dL   BUN 6  6 - 23 mg/dL   Creatinine, Ser 7.82 (*) 0.50 - 1.10 mg/dL   Calcium 9.1  8.4 - 95.6 mg/dL   GFR calc non Af Amer >90  >90 mL/min   GFR calc Af Amer >90  >90 mL/min  URINALYSIS, ROUTINE W REFLEX MICROSCOPIC      Component Value Range   Color, Urine YELLOW  YELLOW   APPearance CLEAR  CLEAR   Specific Gravity, Urine 1.020  1.005 - 1.030   pH 7.0  5.0 - 8.0   Glucose, UA NEGATIVE  NEGATIVE mg/dL   Hgb urine dipstick NEGATIVE  NEGATIVE   Bilirubin Urine NEGATIVE  NEGATIVE   Ketones, ur NEGATIVE  NEGATIVE mg/dL   Protein, ur NEGATIVE  NEGATIVE mg/dL   Urobilinogen, UA 0.2  0.0 - 1.0 mg/dL   Nitrite NEGATIVE  NEGATIVE   Leukocytes, UA NEGATIVE  NEGATIVE    No results found.   1.  Sinusitis   2. Upper respiratory infection   3. Pregnancy       MDM   Patient is [redacted] weeks pregnant. Followed by [facility] has not had her first visit at. Is on prenatal vitamins. Labs here today without any specific findings. No evidence urinary tract infection patient's fetal heart  tones are normal no abdominal pain no vaginal bleeding. Patient was concerned about having a sinusitis she's had that in the past she has had upper respiratory symptoms and has had sinus pressure. Due to her pregnancy CT scan was not done to confirm treat with amoxicillin. Weight. Patient is in no acute distress nontoxic no significant fevers. Vital signs within normal limits. Oxygen saturation is 99% on room air.    I personally performed the services described in this documentation, which was scribed in my presence. The recorded information has been reviewed and considered.           Shelda Jakes, MD 10/22/12 1224

## 2012-10-29 LAB — OB RESULTS CONSOLE ABO/RH: RH Type: POSITIVE

## 2012-10-29 LAB — OB RESULTS CONSOLE VARICELLA ZOSTER ANTIBODY, IGG: Varicella: IMMUNE

## 2012-10-29 LAB — OB RESULTS CONSOLE HEPATITIS B SURFACE ANTIGEN: Hepatitis B Surface Ag: NEGATIVE

## 2012-10-29 LAB — OB RESULTS CONSOLE HIV ANTIBODY (ROUTINE TESTING): HIV: NONREACTIVE

## 2012-12-26 NOTE — L&D Delivery Note (Signed)
Melinda Valenzuela is a 22 y.o. [id number] presenting at [redacted]w[redacted]d with PPROM. She was started on pitocin and progressed to complete dilation without complication.  Delivery Note At 5:18 PM a viable female was delivered via  (Presentation: vertex, LOA).  APGAR: 9, 9; weight pending.   Placenta status: spontaneous, intact.  Cord: 3-vessel.  Complications: none.  Cord pH: n/a  Anesthesia:  epidural Episiotomy: none Lacerations: none  Suture Repair: n/a Est. Blood Loss (mL): 300  Mom to postpartum.  Baby to nursery-stable.  Napoleon Form 04/14/2013, 5:30 PM

## 2013-03-13 ENCOUNTER — Ambulatory Visit (INDEPENDENT_AMBULATORY_CARE_PROVIDER_SITE_OTHER): Payer: Medicaid Other | Admitting: Advanced Practice Midwife

## 2013-03-13 ENCOUNTER — Encounter: Payer: Self-pay | Admitting: Advanced Practice Midwife

## 2013-03-13 VITALS — BP 130/60 | Wt 251.0 lb

## 2013-03-13 DIAGNOSIS — Z331 Pregnant state, incidental: Secondary | ICD-10-CM

## 2013-03-13 DIAGNOSIS — O09299 Supervision of pregnancy with other poor reproductive or obstetric history, unspecified trimester: Secondary | ICD-10-CM

## 2013-03-13 DIAGNOSIS — Z3490 Encounter for supervision of normal pregnancy, unspecified, unspecified trimester: Secondary | ICD-10-CM

## 2013-03-13 DIAGNOSIS — Z1389 Encounter for screening for other disorder: Secondary | ICD-10-CM

## 2013-03-13 DIAGNOSIS — B009 Herpesviral infection, unspecified: Secondary | ICD-10-CM | POA: Insufficient documentation

## 2013-03-13 DIAGNOSIS — O98519 Other viral diseases complicating pregnancy, unspecified trimester: Secondary | ICD-10-CM

## 2013-03-13 LAB — POCT URINALYSIS DIPSTICK
Blood, UA: NEGATIVE
Ketones, UA: NEGATIVE
Leukocytes, UA: NEGATIVE

## 2013-03-13 NOTE — Progress Notes (Signed)
Pt c/o of lower abd. Pain and pressure.

## 2013-03-28 ENCOUNTER — Encounter: Payer: Self-pay | Admitting: *Deleted

## 2013-03-28 ENCOUNTER — Ambulatory Visit (INDEPENDENT_AMBULATORY_CARE_PROVIDER_SITE_OTHER): Payer: Medicaid Other | Admitting: Obstetrics & Gynecology

## 2013-03-28 ENCOUNTER — Encounter: Payer: Self-pay | Admitting: Obstetrics & Gynecology

## 2013-03-28 VITALS — BP 136/68 | Wt 250.0 lb

## 2013-03-28 DIAGNOSIS — O09299 Supervision of pregnancy with other poor reproductive or obstetric history, unspecified trimester: Secondary | ICD-10-CM

## 2013-03-28 DIAGNOSIS — O98519 Other viral diseases complicating pregnancy, unspecified trimester: Secondary | ICD-10-CM

## 2013-03-28 DIAGNOSIS — Z3490 Encounter for supervision of normal pregnancy, unspecified, unspecified trimester: Secondary | ICD-10-CM

## 2013-03-28 DIAGNOSIS — B009 Herpesviral infection, unspecified: Secondary | ICD-10-CM

## 2013-03-28 DIAGNOSIS — G43909 Migraine, unspecified, not intractable, without status migrainosus: Secondary | ICD-10-CM

## 2013-03-28 DIAGNOSIS — Z331 Pregnant state, incidental: Secondary | ICD-10-CM

## 2013-03-28 DIAGNOSIS — Z1389 Encounter for screening for other disorder: Secondary | ICD-10-CM

## 2013-03-28 LAB — POCT URINALYSIS DIPSTICK
Blood, UA: NEGATIVE
Protein, UA: 1

## 2013-03-28 MED ORDER — ACYCLOVIR 400 MG PO TABS: 400.0000 mg | ORAL_TABLET | Freq: Three times a day (TID) | ORAL | Status: DC

## 2013-03-28 NOTE — Progress Notes (Signed)
Patient reports good fetal movement, denies any bleeding and no rupture of membranes symptoms or contraction Having significant marital issues.  All questions answered.  BP rechecked.  Start acyclovir.  Urine and weight noted.

## 2013-03-28 NOTE — Patient Instructions (Signed)
Herpes During and After Pregnancy Genital herpes is a sexually transmitted infection (STI). It is caused by a virus and can be very serious during pregnancy. The greatest concern is passing the virus to the fetus and newborn. The virus is more likely to be passed to the newborn during delivery if the mother becomes infected for the first time late in her pregnancy (primary infection). It is less common for the virus to pass to the newborn if the mother had herpes before becoming pregnant. This is because antibodies against the virus develop over a period of time. These antibodies help protect the baby. Lastly, the infection can pass to the fetus through the placenta. This can happen if the mother gets herpes for the first time in the first 3 months of her pregnancy (first trimester). This can possibly cause a miscarriage or birth defects in the baby. TREATMENT DURING PREGNANCY Medicines may be prescribed that are safe for the mother and the fetus. The medicine can lessen symptoms or prevent a recurrence of the infection. If the infection happened before becoming pregnant, medicine may be prescribed in the last 4 weeks of the pregnancy. This can prevent a recurrent infection at the time of delivery.  RECOMMENDED DELIVERY METHOD If an active, recurrent infection is present at the time delivery, the baby should be delivered by cesarean delivery. This is because the virus can pass to the baby through an infected birth canal. This can cause severe problems for the baby. If the infection happens for the first time late in the pregnancy, the caregiver may also recommend a cesarean delivery. With a new infection, the body has not had the time to build up enough antibodies against the virus to protect the baby from getting the infection. Even if the birth canal does not have visible sores (lesions) from the herpes virus, there is still that chance that the virus can spread to the baby. A cesarean delivery should be  done if there is any signs or feeling of an infection being present in the genital area. Cesarean delivery is not recommended for women with a history of herpes infection but no evidence of active genital lesions at the time of delivery. Lesions that have crusted fully are considered healed and not active. BREASTFEEDING  Women infected with genital herpes can breastfeed their baby. The virus will not be present in the breast milk. If lesions are present on the breast, the baby should not breastfeed from the affected breast(s). HOW TO PREVENT PASSING HERPES TO YOUR BABY AFTER DELIVERY  Wash your hands with soap and water often and before touching your baby.  If you have an outbreak, keep the area clean and covered.  Try to avoid physical and stressful situations that may bring on an outbreak. SEEK IMMEDIATE MEDICAL CARE IF:   You have an outbreak during pregnancy and cannot urinate.  You have an outbreak anytime during your pregnancy and especially in the last 3 months of the pregnancy.  You think you are having an allergic reaction or side effects from the medicine you are taking. Document Released: 03/20/2001 Document Revised: 03/05/2012 Document Reviewed: 11/22/2011 Exit[employer] Patient Information 2013 [employer].  

## 2013-04-11 ENCOUNTER — Encounter: Payer: Self-pay | Admitting: Obstetrics & Gynecology

## 2013-04-11 ENCOUNTER — Ambulatory Visit (INDEPENDENT_AMBULATORY_CARE_PROVIDER_SITE_OTHER): Payer: Medicaid Other | Admitting: Obstetrics & Gynecology

## 2013-04-11 VITALS — BP 130/80 | Wt 252.0 lb

## 2013-04-11 DIAGNOSIS — F1920 Other psychoactive substance dependence, uncomplicated: Secondary | ICD-10-CM

## 2013-04-11 DIAGNOSIS — O98519 Other viral diseases complicating pregnancy, unspecified trimester: Secondary | ICD-10-CM

## 2013-04-11 DIAGNOSIS — O09299 Supervision of pregnancy with other poor reproductive or obstetric history, unspecified trimester: Secondary | ICD-10-CM

## 2013-04-11 DIAGNOSIS — Z3493 Encounter for supervision of normal pregnancy, unspecified, third trimester: Secondary | ICD-10-CM

## 2013-04-11 MED ORDER — BUTALBITAL-APAP-CAFFEINE 50-325-40 MG PO TABS: 1.0000 | ORAL_TABLET | Freq: Four times a day (QID) | ORAL | Status: DC | PRN

## 2013-04-11 NOTE — Patient Instructions (Signed)
Epidural Risks and Benefits The continuous putting in (infusion) of local anesthetics through a long, narrow, hollow plastic tube (catheter)/needle into the lower (lumbar) area of your spine is commonly called an epidural. This means outside the covering of the spinal cord. The epidural catheter is placed in the space on the outside of the membrane that covers the spinal cord. The anesthetic medicine numbs the nerves of the spinal cord in the epidural space. There is also a spinal/epidural anesthetic using two needles and a catheter. The medication is first placed in the spinal canal. Then that needle is removed and a catheter is placed in the epidural space through the second needle for continuous anesthesia. This seems to be the most popular type of regional anesthesia used now. This is sometimes given for pain management to women who are giving birth. Spinal and epidural anesthesia are called regional anesthesia because they numb a certain region of the body. While it is an effective pain management tool, some reasons not to use this include:  Restricted mobility: The tubes and monitors connected to you do not allow for much moving around.  Increased likelihood of bladder catheterization, oxytocin administration, and internal monitoring. This means a tube (catheter) may have to be put into the bladder to drain the urine. Uterine contractions can become weaker and less frequent. They also may have a higher use of oxytocin than mothers not having regional anesthesia.  Increased likelihood of operative delivery: This includes the use of or need for forceps, vacuum extractor, episiotomy, or cesarean delivery. When the dose is too large, or when it sinks down into the "tailbone" (sacral) region of the body, the perineum and the birth canal (vagina) are anesthetized. Anesthetic is injected into this area late in labor to deaden all sensation. When it "accidentally" happens earlier in labor, the muscles of the  pelvic floor are relaxed too early. This interferes with the normal flexion and rotation of the baby's head as it passes through the birth canal. This interference can lead to abnormal presentations that are more dangerous for the baby.  Must use an automatic blood pressure cuff throughout labor. This is a cuff that automatically takes your blood pressure at regular intervals. SHORT TERM MATERNAL RISKS  Dural puncture - The dura is one of the membranes surrounding the spinal cord. If the anesthetic medication gets into the spinal canal through a dural puncture, it can result in a spinal anesthetic and spinal headache. Spinal headaches are treated with an epidural blood patch to cover the punctured area.  Low blood pressure (hypotension) - Nearly one third of women with an epidural will develop low blood pressure. The ways that patients must lay during the epidural can make this worse. Their position is limited because they will be unable to move their legs easily for the time of the anesthetic. Low blood pressure is also a risk for the baby. If the baby does not get enough oxygen from the mom's blood, it can result in an emergency Cesarean section. This means the baby is delivered by an operation through a cut by the surgeon (incision) on the belly of the mother.  Nausea, vomiting, and prolonged shivering.  Prolonged labor - With large doses of anesthetic medication, the patient loses the desire and the ability to bear down and push. This results in an increased use of forceps and vacuum extractions, compared to women having unmedicated deliveries.  Uneven, incomplete or non-existent pain relief. Sometimes the epidural does not work well and   additional medications may be needed for pain relief.  Difficulty breathing well or paralysis if the level of anesthesia goes too high in the spine.  Convulsions - If the anesthetic agent accidentally is injected into a blood vessel it can cause convulsions and  loss of consciousness.  Toxic drug reactions.  Septic meningitis - An abscess can form at the site where the epidural catheter is placed. If this spreads into the spinal canal it can cause meningitis.  Allergic reaction - This causes blood pressure to become too low and other medications and fluids must be given to bring the blood pressure up. Also rashes and difficulty breathing may develop.  Cardiac arrest - This is rare but real threat to the life of the mother and baby.  Fever is common.  Itching that is easily treated.  Spinal hematoma. LONG TERM MATERNAL RISKS  Neurological complications - A nerve problem called Horner's syndrome can develop with epidural anesthesia for vaginal delivery. It is impossible to predict which patients will develop a Horner's syndrome. Even the nerves to the face can be blocked, temporarily or permanently. Tremors and shakes can occur.  Paresthesia ("pins and needles"). This is a feeling that comes from inflammation of a nerve.  Dizziness and fainting can become a problem after epidurals. This is usually only for a couple of days. RISKS TO BABY  Direct drug toxicity.  Fetal distress, abnormal fetal heart rate (FHR) (can lead to emergency cesarean). This is especially true if the anesthetic gets into the mother's blood stream or too much medication is put into the epidural. REASONS NOT TO HAVE EPIDURAL ANESTHESIA  Increased costs.  The mother has a low blood pressure.  There are blood clotting problems.  A brain tumor is present.  There is an infection in the blood stream.  A skin infection at the needle site.  A tattoo at the needle site. BENEFITS  Regional anesthesia is the most effective pain relief for labor and delivery.  It is the best anesthetic for preeclampsia and eclampsia.  There is better pain control after delivery (vaginal or cesarean).  When done correctly, no medication gets to the baby.  Sooner ambulation after  delivery.  It can be left in place during all of labor.  You can be awake during a Cesarean delivery and see the baby immediately after delivery. AFTER THE PROCEDURE   You will be kept in bed for several hours to prevent headaches.  You will be kept in bed until your legs are no longer numb and it is safe to walk.  The length of time you spend in the hospital will depend on the type of surgery or procedure you have had.  The epidural catheter is removed after you no longer need it for pain. HOME CARE INSTRUCTIONS   Do not drive or operate any kind of machinery for at least 24 hours. Make sure there is someone to drive you home.  Do not drink alcohol for at least 24 hours after the anesthesia.  Do not make important decisions for at least 24 hours after the anesthesia.  Drink lots of fluids.  Return to your normal diet.  Keep all your postoperative appointments as scheduled. SEEK IMMEDIATE MEDICAL CARE IF:  You develop a fever or temperature over 98.6 F (37 C).  You have a persistent headache.  You develop dizziness, fainting or lightheadedness.  You develop weakness, numbness or tingling in your arms or legs.  You have a skin rash.  You   have difficulty breathing  You have a stiff neck with or without stiff back.  You develop chest pain. Document Released: 12/12/2005 Document Revised: 03/05/2012 Document Reviewed: 01/19/2009 [employer] Patient Information 2013 [employer].  

## 2013-04-11 NOTE — Progress Notes (Signed)
Patient with a headache for a week off on.  BBifrontal no sinus or allergy problems.  No sinus tenderness BP and urine negative BP weight and urine results all reviewed and noted. Patient reports good fetal movement, denies any bleeding and no rupture of membranes symptoms or regular contractions. Patient is without complaints. All questions were answered. Fioricet prn headache

## 2013-04-11 NOTE — Progress Notes (Signed)
PT unable to void.  Will try before she has headaches and dizziness.Melinda Valenzuela

## 2013-04-14 ENCOUNTER — Inpatient Hospital Stay (HOSPITAL_COMMUNITY)
Admission: AD | Admit: 2013-04-14 | Discharge: 2013-04-16 | DRG: 767 | Disposition: A | Discharge status: Home / Self Care | Payer: Medicaid Other | Source: Ambulatory Visit | Attending: Family Medicine | Admitting: Family Medicine

## 2013-04-14 ENCOUNTER — Inpatient Hospital Stay (HOSPITAL_COMMUNITY): Payer: Medicaid Other | Admitting: Anesthesiology

## 2013-04-14 ENCOUNTER — Encounter (HOSPITAL_COMMUNITY): Payer: Self-pay | Admitting: *Deleted

## 2013-04-14 ENCOUNTER — Encounter (HOSPITAL_COMMUNITY): Payer: Self-pay | Admitting: Anesthesiology

## 2013-04-14 DIAGNOSIS — O4290 Premature rupture of membranes, unspecified as to length of time between rupture and onset of labor, unspecified weeks of gestation: Secondary | ICD-10-CM

## 2013-04-14 DIAGNOSIS — B009 Herpesviral infection, unspecified: Secondary | ICD-10-CM

## 2013-04-14 DIAGNOSIS — Z302 Encounter for sterilization: Secondary | ICD-10-CM

## 2013-04-14 LAB — MRSA PCR SCREENING: MRSA by PCR: NEGATIVE

## 2013-04-14 LAB — CBC
HCT: 30.4 % — ABNORMAL LOW (ref 36.0–46.0)
Hemoglobin: 10.1 g/dL — ABNORMAL LOW (ref 12.0–15.0)
MCV: 86.4 fL (ref 78.0–100.0)
RBC: 3.52 MIL/uL — ABNORMAL LOW (ref 3.87–5.11)
RDW: 15.5 % (ref 11.5–15.5)
WBC: 8.8 10*3/uL (ref 4.0–10.5)

## 2013-04-14 LAB — RPR: RPR Ser Ql: NONREACTIVE

## 2013-04-14 LAB — ABO/RH: ABO/RH(D): O POS

## 2013-04-14 LAB — TYPE AND SCREEN: Antibody Screen: NEGATIVE

## 2013-04-14 MED ORDER — PNEUMOCOCCAL VAC POLYVALENT 25 MCG/0.5ML IJ INJ
0.5000 mL | INJECTION | INTRAMUSCULAR | Status: AC
  Administered 2013-04-15: 0.5 mL via INTRAMUSCULAR
  Filled 2013-04-14: qty 0.5

## 2013-04-14 MED ORDER — FENTANYL 2.5 MCG/ML BUPIVACAINE 1/10 % EPIDURAL INFUSION (WH - ANES)
INTRAMUSCULAR | Status: DC | PRN
  Administered 2013-04-14: 14 mL/h via EPIDURAL

## 2013-04-14 MED ORDER — FENTANYL CITRATE 0.05 MG/ML IJ SOLN: 100.0000 ug | INTRAMUSCULAR | Status: DC | PRN

## 2013-04-14 MED ORDER — FLEET ENEMA 7-19 GM/118ML RE ENEM: 1.0000 | ENEMA | RECTAL | Status: DC | PRN

## 2013-04-14 MED ORDER — SIMETHICONE 80 MG PO CHEW: 80.0000 mg | CHEWABLE_TABLET | ORAL | Status: DC | PRN

## 2013-04-14 MED ORDER — ONDANSETRON HCL 4 MG/2ML IJ SOLN: 4.0000 mg | INTRAMUSCULAR | Status: DC | PRN

## 2013-04-14 MED ORDER — LACTATED RINGERS IV SOLN
500.0000 mL | Freq: Once | INTRAVENOUS | Status: AC
  Administered 2013-04-14: 1000 mL via INTRAVENOUS

## 2013-04-14 MED ORDER — FENTANYL 2.5 MCG/ML BUPIVACAINE 1/10 % EPIDURAL INFUSION (WH - ANES)
14.0000 mL/h | INTRAMUSCULAR | Status: DC | PRN
  Administered 2013-04-14: 14 mL/h via EPIDURAL
  Filled 2013-04-14 (×2): qty 125

## 2013-04-14 MED ORDER — WITCH HAZEL-GLYCERIN EX PADS: 1.0000 "application " | MEDICATED_PAD | CUTANEOUS | Status: DC | PRN

## 2013-04-14 MED ORDER — OXYTOCIN 40 UNITS IN LACTATED RINGERS INFUSION - SIMPLE MED
62.5000 mL/h | INTRAVENOUS | Status: DC
  Administered 2013-04-14: 999 mL/h via INTRAVENOUS

## 2013-04-14 MED ORDER — BENZOCAINE-MENTHOL 20-0.5 % EX AERO
1.0000 "application " | INHALATION_SPRAY | CUTANEOUS | Status: DC | PRN
  Administered 2013-04-14: 1 via TOPICAL
  Filled 2013-04-14: qty 56

## 2013-04-14 MED ORDER — LANOLIN HYDROUS EX OINT: TOPICAL_OINTMENT | CUTANEOUS | Status: DC | PRN

## 2013-04-14 MED ORDER — ACETAMINOPHEN 325 MG PO TABS
650.0000 mg | ORAL_TABLET | ORAL | Status: DC | PRN
  Administered 2013-04-14: 650 mg via ORAL
  Filled 2013-04-14: qty 2

## 2013-04-14 MED ORDER — SENNOSIDES-DOCUSATE SODIUM 8.6-50 MG PO TABS
2.0000 | ORAL_TABLET | Freq: Every day | ORAL | Status: DC
  Administered 2013-04-14 – 2013-04-15 (×2): 2 via ORAL

## 2013-04-14 MED ORDER — OXYCODONE-ACETAMINOPHEN 5-325 MG PO TABS: 1.0000 | ORAL_TABLET | ORAL | Status: DC | PRN

## 2013-04-14 MED ORDER — LIDOCAINE HCL (PF) 1 % IJ SOLN
INTRAMUSCULAR | Status: DC | PRN
  Administered 2013-04-14 (×2): 5 mL
  Administered 2013-04-14: 3 mL

## 2013-04-14 MED ORDER — CITRIC ACID-SODIUM CITRATE 334-500 MG/5ML PO SOLN: 30.0000 mL | ORAL | Status: DC | PRN

## 2013-04-14 MED ORDER — PHENYLEPHRINE 40 MCG/ML (10ML) SYRINGE FOR IV PUSH (FOR BLOOD PRESSURE SUPPORT)
80.0000 ug | PREFILLED_SYRINGE | INTRAVENOUS | Status: DC | PRN
  Filled 2013-04-14: qty 2

## 2013-04-14 MED ORDER — ONDANSETRON HCL 4 MG/2ML IJ SOLN: 4.0000 mg | Freq: Four times a day (QID) | INTRAMUSCULAR | Status: DC | PRN

## 2013-04-14 MED ORDER — OXYTOCIN 40 UNITS IN LACTATED RINGERS INFUSION - SIMPLE MED: 62.5000 mL/h | INTRAVENOUS | Status: DC | PRN

## 2013-04-14 MED ORDER — OXYTOCIN 40 UNITS IN LACTATED RINGERS INFUSION - SIMPLE MED
1.0000 m[IU]/min | INTRAVENOUS | Status: DC
  Administered 2013-04-14: 4 m[IU]/min via INTRAVENOUS
  Administered 2013-04-14: 2 m[IU]/min via INTRAVENOUS
  Filled 2013-04-14: qty 1000

## 2013-04-14 MED ORDER — DIBUCAINE 1 % RE OINT: 1.0000 "application " | TOPICAL_OINTMENT | RECTAL | Status: DC | PRN

## 2013-04-14 MED ORDER — OXYTOCIN BOLUS FROM INFUSION: 500.0000 mL | INTRAVENOUS | Status: DC

## 2013-04-14 MED ORDER — FERROUS SULFATE 325 (65 FE) MG PO TABS
325.0000 mg | ORAL_TABLET | Freq: Two times a day (BID) | ORAL | Status: DC
  Administered 2013-04-15 – 2013-04-16 (×2): 325 mg via ORAL
  Filled 2013-04-14 (×2): qty 1

## 2013-04-14 MED ORDER — DIPHENHYDRAMINE HCL 25 MG PO CAPS: 25.0000 mg | ORAL_CAPSULE | Freq: Four times a day (QID) | ORAL | Status: DC | PRN

## 2013-04-14 MED ORDER — LACTATED RINGERS IV SOLN
INTRAVENOUS | Status: DC
  Administered 2013-04-14: 15:00:00 via INTRAVENOUS

## 2013-04-14 MED ORDER — IBUPROFEN 600 MG PO TABS
600.0000 mg | ORAL_TABLET | Freq: Four times a day (QID) | ORAL | Status: DC
  Administered 2013-04-15 – 2013-04-16 (×6): 600 mg via ORAL
  Filled 2013-04-14 (×6): qty 1

## 2013-04-14 MED ORDER — EPHEDRINE 5 MG/ML INJ
10.0000 mg | INTRAVENOUS | Status: DC | PRN
  Filled 2013-04-14: qty 2
  Filled 2013-04-14 (×2): qty 4

## 2013-04-14 MED ORDER — TERBUTALINE SULFATE 1 MG/ML IJ SOLN: 0.2500 mg | Freq: Once | INTRAMUSCULAR | Status: DC | PRN

## 2013-04-14 MED ORDER — TETANUS-DIPHTH-ACELL PERTUSSIS 5-2.5-18.5 LF-MCG/0.5 IM SUSP
0.5000 mL | Freq: Once | INTRAMUSCULAR | Status: AC
  Administered 2013-04-15: 0.5 mL via INTRAMUSCULAR

## 2013-04-14 MED ORDER — OXYCODONE-ACETAMINOPHEN 5-325 MG PO TABS
1.0000 | ORAL_TABLET | ORAL | Status: DC | PRN
  Administered 2013-04-15 (×3): 1 via ORAL
  Administered 2013-04-16: 2 via ORAL
  Administered 2013-04-16: 1 via ORAL
  Administered 2013-04-16: 2 via ORAL
  Filled 2013-04-14: qty 2
  Filled 2013-04-14 (×2): qty 1
  Filled 2013-04-14 (×2): qty 2
  Filled 2013-04-14: qty 1

## 2013-04-14 MED ORDER — IBUPROFEN 600 MG PO TABS
600.0000 mg | ORAL_TABLET | Freq: Four times a day (QID) | ORAL | Status: DC | PRN
  Administered 2013-04-14: 600 mg via ORAL
  Filled 2013-04-14: qty 1

## 2013-04-14 MED ORDER — FLEET ENEMA 7-19 GM/118ML RE ENEM: 1.0000 | ENEMA | Freq: Every day | RECTAL | Status: DC | PRN

## 2013-04-14 MED ORDER — EPHEDRINE 5 MG/ML INJ
10.0000 mg | INTRAVENOUS | Status: DC | PRN
  Filled 2013-04-14: qty 2

## 2013-04-14 MED ORDER — MEASLES, MUMPS & RUBELLA VAC ~~LOC~~ INJ: 0.5000 mL | INJECTION | Freq: Once | SUBCUTANEOUS | Status: DC

## 2013-04-14 MED ORDER — LACTATED RINGERS IV SOLN
500.0000 mL | INTRAVENOUS | Status: DC | PRN
  Administered 2013-04-14: 1000 mL via INTRAVENOUS

## 2013-04-14 MED ORDER — LIDOCAINE HCL (PF) 1 % IJ SOLN
30.0000 mL | INTRAMUSCULAR | Status: DC | PRN
  Filled 2013-04-14 (×2): qty 30

## 2013-04-14 MED ORDER — DIPHENHYDRAMINE HCL 50 MG/ML IJ SOLN: 12.5000 mg | INTRAMUSCULAR | Status: DC | PRN

## 2013-04-14 MED ORDER — ONDANSETRON HCL 4 MG PO TABS: 4.0000 mg | ORAL_TABLET | ORAL | Status: DC | PRN

## 2013-04-14 MED ORDER — PHENYLEPHRINE 40 MCG/ML (10ML) SYRINGE FOR IV PUSH (FOR BLOOD PRESSURE SUPPORT)
80.0000 ug | PREFILLED_SYRINGE | INTRAVENOUS | Status: DC | PRN
  Filled 2013-04-14 (×2): qty 5
  Filled 2013-04-14: qty 2

## 2013-04-14 MED ORDER — BISACODYL 10 MG RE SUPP: 10.0000 mg | Freq: Every day | RECTAL | Status: DC | PRN

## 2013-04-14 MED ORDER — ZOLPIDEM TARTRATE 5 MG PO TABS: 5.0000 mg | ORAL_TABLET | Freq: Every evening | ORAL | Status: DC | PRN

## 2013-04-14 MED ORDER — PRENATAL MULTIVITAMIN CH
1.0000 | ORAL_TABLET | Freq: Every day | ORAL | Status: DC
  Administered 2013-04-16: 1 via ORAL
  Filled 2013-04-14: qty 1

## 2013-04-14 NOTE — Progress Notes (Signed)
This note also relates to the following rows which could not be included: BP - Cannot attach notes to unvalidated device data Pulse Rate - Cannot attach notes to unvalidated device data   efm removed per anesthesia

## 2013-04-14 NOTE — H&P (Signed)
Melinda Valenzuela is a 22 y.o. [phone] female at [redacted]w[redacted]d by LMP which correlates well w/ 12wk u/s, presenting w/ report of gush of clear fluid that woke her up at 0310, mild irregular uc's since.  Reports good fm, denies vb or regular painful uc's.  Initiated pnc at [facility] at 12.1wks. Genetic screening and anatomy u/s wnl per pt- reports not in record.  2hr gtt wnl.  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 GBS pcr collected upon admission- pending. 2 previous term svd's. Requests pp BTL.   Maternal Medical History:  Reason for admission: Rupture of membranes.   Contractions: Onset was 1-2 hours ago.   Frequency: irregular.   Perceived severity is mild.    Fetal activity: Perceived fetal activity is normal.   Last perceived fetal movement was within the past hour.    Prenatal complications: Substance abuse: thc.   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    OB History   Grav Para Term Preterm Abortions TAB SAB Ect Mult Living   4 2 2  0 1 0 0 0 0 2     Past Medical History  Diagnosis Date  . Pregnant state, incidental   . HSV-2 (herpes simplex virus 2) infection   . Abnormal Pap smear   . Headache    Past Surgical History  Procedure Laterality Date  . Dilation and curettage of uterus     Family History: family history includes Cancer in her other; Diabetes in her other; Heart attack in her other; Hypertension in her other; and Stroke in her other. Social History:  reports that she has quit smoking. She quit smokeless tobacco use about 14 months ago. She reports that she uses illicit drugs (Marijuana). She reports that she does not drink alcohol.  Review of Systems  Constitutional: Negative.   HENT: Negative.   Eyes: Negative.   Respiratory: Negative.   Cardiovascular: Negative.   Gastrointestinal: Negative.   Genitourinary: Negative.   Musculoskeletal: Negative.   Skin:  Negative.   Neurological: Negative.   Endo/Heme/Allergies: Negative.   Psychiatric/Behavioral: Negative.       Blood pressure 135/83, pulse 89, temperature 98.1 F (36.7 C), resp. rate 20, height 5\' 7"  (1.702 m), weight 114.851 kg (253 lb 3.2 oz), last menstrual period 08/05/2012, SpO2 100.00%. Maternal Exam:  Uterine Assessment: Contraction strength is mild.  Contraction frequency is irregular.   Abdomen: Patient reports no abdominal tenderness. Fetal presentation: vertex  Introitus: Ferning test: positive.  Amniotic fluid character: clear.     Physical Exam  Constitutional: She is oriented to person, place, and time. She appears well-developed and well-nourished.  HENT:  Head: Normocephalic.  Neck: Normal range of motion.  Cardiovascular: Normal rate and regular rhythm.   Respiratory: Effort normal and breath sounds normal.  GI: Soft.  gravid  Genitourinary: Vagina normal and uterus normal.  SVE deferred d/t pprom GBS pcr obtained Vtx confirmed by bs u/s SSE: copious amount of clear non-odorous fluid, unable to visualize cervix  Musculoskeletal: Normal range of motion.  Neurological: She is alert and oriented to person, place, and time. She has normal reflexes.  Skin: Skin is warm and dry.  Psychiatric: She has a normal mood and affect. Her behavior is normal. Judgment and thought content normal.    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

## 2013-04-14 NOTE — Progress Notes (Signed)
Genella Rife CNM at bedside, spec exam, unable to visualize cervix at this time. Dr. Thad Ranger to evaluate pt and will discuss POC at that time. Pt denies any needs.

## 2013-04-14 NOTE — Anesthesia Preprocedure Evaluation (Signed)
Anesthesia Evaluation  Patient identified by MRN, date of birth, ID band Patient awake    Reviewed: Allergy & Precautions, H&P , NPO status , Patient's Chart, lab work & pertinent test results  Airway Mallampati: III TM Distance: >3 FB Neck ROM: full    Dental no notable dental hx.    Pulmonary neg pulmonary ROS,  breath sounds clear to auscultation  Pulmonary exam normal       Cardiovascular negative cardio ROS      Neuro/Psych negative neurological ROS  negative psych ROS   GI/Hepatic negative GI ROS, Neg liver ROS,   Endo/Other  Morbid obesity  Renal/GU negative Renal ROS  negative genitourinary   Musculoskeletal negative musculoskeletal ROS (+)   Abdominal (+) + obese,   Peds negative pediatric ROS (+)  Hematology negative hematology ROS (+)   Anesthesia Other Findings   Reproductive/Obstetrics (+) Pregnancy                           Anesthesia Physical  Anesthesia Plan  ASA: III  Anesthesia Plan: Epidural   Post-op Pain Management:    Induction:   Airway Management Planned:   Additional Equipment:   Intra-op Plan:   Post-operative Plan:   Informed Consent: I have reviewed the patients History and Physical, chart, labs and discussed the procedure including the risks, benefits and alternatives for the proposed anesthesia with the patient or authorized representative who has indicated his/her understanding and acceptance.     Plan Discussed with:   Anesthesia Plan Comments:         Anesthesia Quick Evaluation

## 2013-04-14 NOTE — Anesthesia Procedure Notes (Signed)
Epidural Patient location during procedure: OB  Staffing Anesthesiologist: Phillips Grout Performed by: anesthesiologist   Preanesthetic Checklist Completed: patient identified, site marked, surgical consent, pre-op evaluation, timeout performed, IV checked, risks and benefits discussed and monitors and equipment checked  Epidural Patient position: sitting Prep: ChloraPrep Patient monitoring: heart rate, continuous pulse ox and blood pressure Approach: midline Injection technique: LOR saline  Needle:  Needle type: Tuohy  Needle gauge: 17 G Needle length: 9 cm and 9 Needle insertion depth: 8 cm Catheter type: closed end flexible Catheter size: 20 Guage Catheter at skin depth: 12 cm Test dose: negative  Assessment Events: blood not aspirated, injection not painful, no injection resistance, negative IV test and no paresthesia  Additional Notes   Patient tolerated the insertion well without complications.

## 2013-04-14 NOTE — MAU Note (Signed)
Leaking fld since 0310. Clear fld. Mild contractions

## 2013-04-14 NOTE — Progress Notes (Signed)
Patient ID: Melinda Valenzuela, female   DOB: [birth year], 22 y.o.   MRN: [id number]  S:  Pt beginning to get uncomfortable. Would like epidural  O: Filed Vitals:   04/14/13 0625 04/14/13 0639 04/14/13 0827 04/14/13 0922  BP: 122/77 122/77 129/74 128/70  Pulse: 87 87 83 86  Temp:  98.4 F (36.9 C) 97.4 F (36.3 C)   TempSrc:  Oral Oral   Resp:  20    Height:  5\' 7"  (1.702 m)    Weight:  114.76 kg (253 lb)    SpO2:        Cervix:  3/50/-2 FHTs:  120, moderate variability, accels present, no decels - Cat I Toco: q 3-4 min  A/P. 75 y.o. [id number] at [redacted]w[redacted]d with PPROM - On pitocin - Epidural - Anticipate SVD  Napoleon Form, MD

## 2013-04-14 NOTE — Progress Notes (Signed)
Joellyn Haff CNM called unit and aware of pt's status. Will admit to BS.

## 2013-04-14 NOTE — Progress Notes (Signed)
Report called to [facility] in [facility]. Pt to 166 via w/c

## 2013-04-14 NOTE — Progress Notes (Signed)
Patient ID: Melinda Valenzuela, female   DOB: [birth year], 22 y.o.   MRN: [id number]   S:  Pt uncomfortable, waiting for anesthesia to re-do epidural as it came out.  O:   Filed Vitals:   04/14/13 1445  BP: 117/48  Pulse: 87  Temp:   Resp: 16    Cervix:  4/80-2  FHTs:  120, accels present, moderate variability, no decels - CAT I TOCO:  q 4 min  A/P 43 y.o. [id number] at [redacted]w[redacted]d with PPROM 5 am today. - On pitocin, continue to increase - Replace epidural - Anticipate SVD - FHTs category I  Napoleon Form, MD

## 2013-04-14 NOTE — Progress Notes (Signed)
Lafonda Mosses RN in [facility] answered for Joellyn Haff CNM who is in delivery. CNM aware of srom at 36wks. Will see pt

## 2013-04-15 ENCOUNTER — Encounter (HOSPITAL_COMMUNITY): Payer: Self-pay | Admitting: Registered Nurse

## 2013-04-15 ENCOUNTER — Inpatient Hospital Stay (HOSPITAL_COMMUNITY): Payer: Medicaid Other | Admitting: Registered Nurse

## 2013-04-15 ENCOUNTER — Encounter (HOSPITAL_COMMUNITY)
Admission: AD | Disposition: A | Discharge status: Home / Self Care | Payer: Self-pay | Source: Ambulatory Visit | Attending: Family Medicine

## 2013-04-15 DIAGNOSIS — Z302 Encounter for sterilization: Secondary | ICD-10-CM

## 2013-04-15 HISTORY — PX: TUBAL LIGATION: SHX77

## 2013-04-15 SURGERY — LIGATION, FALLOPIAN TUBE, POSTPARTUM
Anesthesia: Epidural | Site: Abdomen | Laterality: Bilateral | Wound class: Clean

## 2013-04-15 MED ORDER — KETOROLAC TROMETHAMINE 30 MG/ML IJ SOLN
INTRAMUSCULAR | Status: AC
  Filled 2013-04-15: qty 1

## 2013-04-15 MED ORDER — PROPOFOL 10 MG/ML IV EMUL
INTRAVENOUS | Status: AC
  Filled 2013-04-15: qty 20

## 2013-04-15 MED ORDER — LIDOCAINE-EPINEPHRINE (PF) 2 %-1:200000 IJ SOLN
INTRAMUSCULAR | Status: AC
  Filled 2013-04-15: qty 20

## 2013-04-15 MED ORDER — FENTANYL CITRATE 0.05 MG/ML IJ SOLN: 25.0000 ug | INTRAMUSCULAR | Status: DC | PRN

## 2013-04-15 MED ORDER — LACTATED RINGERS IV SOLN
INTRAVENOUS | Status: DC
  Administered 2013-04-15: 09:00:00 via INTRAVENOUS

## 2013-04-15 MED ORDER — FAMOTIDINE 20 MG PO TABS
40.0000 mg | ORAL_TABLET | Freq: Once | ORAL | Status: AC
  Administered 2013-04-15: 40 mg via ORAL
  Filled 2013-04-15: qty 2

## 2013-04-15 MED ORDER — SODIUM BICARBONATE 8.4 % IV SOLN
INTRAVENOUS | Status: AC
  Filled 2013-04-15: qty 50

## 2013-04-15 MED ORDER — FENTANYL CITRATE 0.05 MG/ML IJ SOLN
INTRAMUSCULAR | Status: DC | PRN
  Administered 2013-04-15 (×2): 50 ug via INTRAVENOUS

## 2013-04-15 MED ORDER — BUPIVACAINE HCL (PF) 0.25 % IJ SOLN
INTRAMUSCULAR | Status: DC | PRN
  Administered 2013-04-15: 8 mL

## 2013-04-15 MED ORDER — METOCLOPRAMIDE HCL 10 MG PO TABS
10.0000 mg | ORAL_TABLET | Freq: Once | ORAL | Status: AC
  Administered 2013-04-15: 10 mg via ORAL
  Filled 2013-04-15: qty 1

## 2013-04-15 MED ORDER — LIDOCAINE HCL (CARDIAC) 20 MG/ML IV SOLN
INTRAVENOUS | Status: AC
  Filled 2013-04-15: qty 5

## 2013-04-15 MED ORDER — BUPIVACAINE HCL (PF) 0.25 % IJ SOLN
INTRAMUSCULAR | Status: AC
  Filled 2013-04-15: qty 30

## 2013-04-15 MED ORDER — KETOROLAC TROMETHAMINE 30 MG/ML IJ SOLN
INTRAMUSCULAR | Status: DC | PRN
  Administered 2013-04-15: 30 mg via INTRAVENOUS

## 2013-04-15 MED ORDER — DEXAMETHASONE SODIUM PHOSPHATE 10 MG/ML IJ SOLN
INTRAMUSCULAR | Status: AC
  Filled 2013-04-15: qty 1

## 2013-04-15 MED ORDER — ONDANSETRON HCL 4 MG/2ML IJ SOLN
INTRAMUSCULAR | Status: DC | PRN
  Administered 2013-04-15: 4 mg via INTRAVENOUS

## 2013-04-15 MED ORDER — ONDANSETRON HCL 4 MG/2ML IJ SOLN
INTRAMUSCULAR | Status: AC
  Filled 2013-04-15: qty 2

## 2013-04-15 MED ORDER — FENTANYL CITRATE 0.05 MG/ML IJ SOLN
INTRAMUSCULAR | Status: AC
  Filled 2013-04-15: qty 2

## 2013-04-15 MED ORDER — DEXAMETHASONE SODIUM PHOSPHATE 10 MG/ML IJ SOLN
INTRAMUSCULAR | Status: DC | PRN
  Administered 2013-04-15: 10 mg via INTRAVENOUS

## 2013-04-15 MED ORDER — PROPOFOL 10 MG/ML IV EMUL
INTRAVENOUS | Status: DC | PRN
  Administered 2013-04-15 (×3): 20 mg via INTRAVENOUS

## 2013-04-15 MED ORDER — LIDOCAINE HCL (CARDIAC) 20 MG/ML IV SOLN
INTRAVENOUS | Status: DC | PRN
  Administered 2013-04-15: 40 mg via INTRAVENOUS

## 2013-04-15 MED ORDER — MIDAZOLAM HCL 5 MG/5ML IJ SOLN
INTRAMUSCULAR | Status: DC | PRN
  Administered 2013-04-15 (×2): 1 mg via INTRAVENOUS

## 2013-04-15 MED ORDER — SODIUM BICARBONATE 8.4 % IV SOLN
INTRAVENOUS | Status: DC | PRN
  Administered 2013-04-15: 5 mL via EPIDURAL

## 2013-04-15 MED ORDER — MIDAZOLAM HCL 2 MG/2ML IJ SOLN
INTRAMUSCULAR | Status: AC
  Filled 2013-04-15: qty 2

## 2013-04-15 SURGICAL SUPPLY — 19 items
CHLORAPREP W/TINT 26ML (MISCELLANEOUS) ×2 IMPLANT
CONTAINER PREFILL 10% NBF 15ML (MISCELLANEOUS) ×4 IMPLANT
DRSG COVADERM PLUS 2X2 (GAUZE/BANDAGES/DRESSINGS) ×2 IMPLANT
GLOVE BIOGEL PI IND STRL 6.5 (GLOVE) ×1 IMPLANT
GLOVE BIOGEL PI INDICATOR 6.5 (GLOVE) ×1
GLOVE SURG SS PI 6.0 STRL IVOR (GLOVE) ×2 IMPLANT
GOWN PREVENTION PLUS LG XLONG (DISPOSABLE) ×4 IMPLANT
NEEDLE HYPO 25X1 1.5 SAFETY (NEEDLE) ×2 IMPLANT
NS IRRIG 1000ML POUR BTL (IV SOLUTION) ×2 IMPLANT
PACK ABDOMINAL MINOR (CUSTOM PROCEDURE TRAY) ×2 IMPLANT
SPONGE LAP 4X18 X RAY DECT (DISPOSABLE) IMPLANT
SUT PLAIN 0 NONE (SUTURE) ×2 IMPLANT
SUT VIC AB 0 CT1 27 (SUTURE) ×4
SUT VIC AB 0 CT1 27XBRD ANBCTR (SUTURE) ×2 IMPLANT
SUT VIC AB 3-0 PS2 18 (SUTURE) ×2 IMPLANT
SYR CONTROL 10ML LL (SYRINGE) ×2 IMPLANT
TOWEL OR 17X24 6PK STRL BLUE (TOWEL DISPOSABLE) ×4 IMPLANT
TRAY FOLEY CATH 14FR (SET/KITS/TRAYS/PACK) ×2 IMPLANT
WATER STERILE IRR 1000ML POUR (IV SOLUTION) ×2 IMPLANT

## 2013-04-15 NOTE — Progress Notes (Signed)
Post Partum Day 1 Subjective: no complaints, up ad lib, voiding, tolerating PO and has been NPO since midnight in preparation for BTL  Objective: Blood pressure 122/61, pulse 65, temperature 98.2 F (36.8 C), temperature source Oral, resp. rate 18, height 5\' 7"  (1.702 m), weight 253 lb (114.76 kg), last menstrual period 08/05/2012, SpO2 97.00%, unknown if currently breastfeeding.  Physical Exam:  General: alert, cooperative and no distress Lochia: appropriate Uterine Fundus: firm, NT Incision: n/a DVT Evaluation: No evidence of DVT seen on physical exam.   Recent Labs  04/14/13 0635  HGB 10.1*  HCT 30.4*    Assessment/Plan: Plan for discharge tomorrow and Contraception BTL today Risks, benefits and alternatives were explained including but not limited to risks of bleeding, infection and damage to adjacent organs. Patient verbalized understanding and wishes to proceed with permanent sterilization   LOS: 1 day   Melinda Valenzuela 04/15/2013, 9:07 AM

## 2013-04-15 NOTE — Anesthesia Postprocedure Evaluation (Deleted)
  Anesthesia Post-op Note  Patient: Melinda Valenzuela  Procedure(s) Performed: * No procedures listed *  Patient Location: Mother/Baby  Anesthesia Type:Epidural  Level of Consciousness: awake, alert  and oriented  Airway and Oxygen Therapy: Patient Spontanous Breathing  Post-op Pain: mild  Post-op Assessment: Patient's Cardiovascular Status Stable and Respiratory Function Stable  Post-op Vital Signs: stable  Complications: No apparent anesthesia complications

## 2013-04-15 NOTE — Transfer of Care (Signed)
Immediate Anesthesia Transfer of Care Note  Patient: Melinda Valenzuela  Procedure(s) Performed: Procedure(s): POST PARTUM TUBAL LIGATION (Bilateral)  Patient Location: PACU  Anesthesia Type:Epidural  Level of Consciousness: awake, alert  and oriented  Airway & Oxygen Therapy: Patient Spontanous Breathing and Patient connected to face mask oxygen  Post-op Assessment: Report given to PACU RN  Post vital signs: Reviewed  Complications: No apparent anesthesia complications

## 2013-04-15 NOTE — Op Note (Signed)
04/15/2013  PREOPERATIVE DIAGNOSIS:  Undesired fertility  POSTOPERATIVE DIAGNOSIS:  Undesired fertility  PROCEDURE:  Postpartum Bilateral Tubal Sterilization using Pomeroy method   ANESTHESIA:  Epidural  COMPLICATIONS:  None immediate.  ESTIMATED BLOOD LOSS:  Less than 20cc.  FLUIDS: 700 cc LR.  URINE OUTPUT:  100 cc of clear urine.  INDICATIONS: 22 y.o. yo [phone]  with undesired fertility,status post vaginal delivery, desires permanent sterilization. Risks and benefits of procedure discussed with patient including permanence of method, bleeding, infection, injury to surrounding organs and need for additional procedures. Risk failure of 0.5-1% with increased risk of ectopic gestation if pregnancy occurs was also discussed with patient.   FINDINGS:  Normal uterus, tubes, and ovaries.  TECHNIQUE: After informed consent was obtained, the patient was taken to the operating room where anesthesia was induced and found to be adequate. A small transverse, infraumbilical skin incision was made with the scalpel. This incision was carried down to the underlying layer of fascia. The fascia was grasped with Kocher clamps tented up and entered sharply with Mayo scissors. Underlying peritoneum was then identified tented up and entered sharply with Metzenbaum scissors. The fascia was tagged with 0 Vicryl. The patient's left fallopian tube was then identified, brought to the incision, and grasped with a Babcock clamp. The tube was then followed out to the fimbria. The Babcock clamp was then used to grasp the tube approximately 4 cm from the cornual region. A 3 cm segment of the tube was then ligated with free tie of plain gut suture, transected and excised. Good hemostasis was noted and the tube was returned to the abdomen. The right fallopian tube was then identified to its fimbriated end, ligated, and a 3 cm segment excised in a similar fashion. Excellent hemostasis was noted, and the tube returned to the  abdomen. The fascia was re-approximated with 0 Vicryl. The skin was closed in a subcuticular fashion with 3-0 Vicryl. Quarter percent Marcaine solution was then injected at the incision site. The patient tolerated the procedure well. Sponge, lap, and needle count were correct x2. The patient was taken to recovery room in stable condition.

## 2013-04-15 NOTE — Anesthesia Postprocedure Evaluation (Signed)
  Anesthesia Post-op Note  Patient: Melinda Valenzuela  Procedure(s) Performed: Procedure(s): POST PARTUM TUBAL LIGATION (Bilateral)   Patient is awake, responsive, moving her legs, and has signs of resolution of her numbness. Pain and nausea are reasonably well controlled. Vital signs are stable and clinically acceptable. Oxygen saturation is clinically acceptable. There are no apparent anesthetic complications at this time. Patient is ready for discharge.

## 2013-04-15 NOTE — Anesthesia Preprocedure Evaluation (Addendum)
Anesthesia Evaluation  Patient identified by MRN, date of birth, ID band Patient awake    Reviewed: Allergy & Precautions, H&P , NPO status , Patient's Chart, lab work & pertinent test results  Airway Mallampati: III TM Distance: >3 FB Neck ROM: full    Dental no notable dental hx.    Pulmonary neg pulmonary ROS,  breath sounds clear to auscultation  Pulmonary exam normal       Cardiovascular negative cardio ROS      Neuro/Psych negative neurological ROS  negative psych ROS   GI/Hepatic negative GI ROS, Neg liver ROS,   Endo/Other  Morbid obesity  Renal/GU negative Renal ROS  negative genitourinary   Musculoskeletal negative musculoskeletal ROS (+)   Abdominal (+) + obese,   Peds negative pediatric ROS (+)  Hematology negative hematology ROS (+)   Anesthesia Other Findings   Reproductive/Obstetrics                           Anesthesia Physical  Anesthesia Plan  ASA: III  Anesthesia Plan: Epidural   Post-op Pain Management:    Induction:   Airway Management Planned:   Additional Equipment:   Intra-op Plan:   Post-operative Plan:   Informed Consent: I have reviewed the patients History and Physical, chart, labs and discussed the procedure including the risks, benefits and alternatives for the proposed anesthesia with the patient or authorized representative who has indicated his/her understanding and acceptance.     Plan Discussed with:   Anesthesia Plan Comments:         Anesthesia Quick Evaluation

## 2013-04-15 NOTE — Progress Notes (Signed)
UR chart review completed.  

## 2013-04-15 NOTE — Preoperative (Signed)
Beta Blockers   Reason not to administer Beta Blockers:Not Applicable 

## 2013-04-15 NOTE — H&P (Signed)
Attestation of Attending Supervision of Advanced Practitioner: Evaluation and management procedures were performed by the PA/NP/CNM/OB Fellow under my supervision/collaboration, and that of my colleagues. Chart reviewed and agree with management and plan.  Antanette Richwine V 04/15/2013 7:39 AM

## 2013-04-16 ENCOUNTER — Encounter (HOSPITAL_COMMUNITY): Payer: Self-pay | Admitting: Obstetrics and Gynecology

## 2013-04-16 MED ORDER — DOCUSATE SODIUM 100 MG PO CAPS: 100.0000 mg | ORAL_CAPSULE | Freq: Two times a day (BID) | ORAL | Status: DC | PRN

## 2013-04-16 MED ORDER — FERROUS SULFATE 325 (65 FE) MG PO TABS: 325.0000 mg | ORAL_TABLET | Freq: Two times a day (BID) | ORAL | Status: DC

## 2013-04-16 MED ORDER — IBUPROFEN 600 MG PO TABS: 600.0000 mg | ORAL_TABLET | Freq: Four times a day (QID) | ORAL | Status: DC | PRN

## 2013-04-16 MED ORDER — OXYCODONE-ACETAMINOPHEN 5-325 MG PO TABS: 1.0000 | ORAL_TABLET | ORAL | Status: DC | PRN

## 2013-04-16 NOTE — Anesthesia Postprocedure Evaluation (Signed)
Anesthesia Post Note  Patient: Melinda Valenzuela  Procedure(s) Performed: * No procedures listed *  Anesthesia type: Epidural  Patient location: Mother/Baby  Post pain: Pain level controlled  Post assessment: Post-op Vital signs reviewed  Last Vitals:  Filed Vitals:   04/16/13 0545  BP: 119/60  Pulse: 65  Temp: 36.9 C  Resp: 18    Post vital signs: Reviewed  Level of consciousness: awake  Complications: No apparent anesthesia complications

## 2013-04-16 NOTE — Clinical Social Work Maternal (Signed)
    Clinical Social Work Department PSYCHOSOCIAL ASSESSMENT - MATERNAL/CHILD 04/16/2013  Patient:  Melinda Valenzuela, Melinda Valenzuela  Account Number:  [id number]  Admit Date:  04/14/2013  Marjo Bicker Name:   Aundria Rud    Clinical Social Worker:  Nobie Putnam, LCSW   Date/Time:  04/16/2013 02:14 PM  Date Referred:  04/16/2013   Referral source  CN     Referred reason  Substance Abuse   Other referral source:    I:  FAMILY / HOME ENVIRONMENT Child's legal guardian:  PARENT  Guardian - Name Guardian - Age Guardian - Address  Mignon Bechler [address] [place]; [place], [place] [address]  Lucia Bitter 25 incarcerated   Other household support members/support persons Name Relationship DOB   DAUGHTER 2010   DAUGHTER 2013   Other support:   Richarda Blade, grandmother  Tempie Donning, mother    II  PSYCHOSOCIAL DATA Information Source:  Patient Interview  Event organiser Employment:   Financial resources:  [employer] If Medicaid - County:  [employer] Other  Food Stamps  WIC   School / Grade:   Maternity Care Coordinator / Child Services Coordination / Early Interventions:  Cultural issues impacting care:    III  STRENGTHS Strengths  Adequate Resources  Home prepared for Child (including basic supplies)  Supportive family/friends   Strength comment:    IV  RISK FACTORS AND CURRENT PROBLEMS Current Problem:  YES   Risk Factor & Current Problem Patient Issue Family Issue Risk Factor / Current Problem Comment  Substance Abuse Y N Hx of MJ use    V  SOCIAL WORK ASSESSMENT CSW referral received to assess history of MJ use.  Pt admits to smoking however told CSW that she did not smoke often.  When she learned of pregnancy, she was in the process of quitting.  She admits to smoking some during the pregnancy to help with appetite.  She has not smoked in the past 2-3 months. Pt denies other illegal substance use & verbalized understanding of hospital drug  testing policy. UDS is negative, meconium results are pending.  Her spouse is currently incarcerated but supportive, as per pt. She has all the necessary supplies for the infant & good family support.      VI SOCIAL WORK PLAN Social Work Plan  No Further Intervention Required / No Barriers to Discharge   Type of pt/family education:   If child protective services report - county:   If child protective services report - date:   Information/referral to community resources comment:   Other social work plan:

## 2013-04-16 NOTE — Anesthesia Postprocedure Evaluation (Signed)
Anesthesia Post Note  Patient: Melinda Valenzuela  Procedure(s) Performed: Procedure(s) (LRB): POST PARTUM TUBAL LIGATION (Bilateral)  Anesthesia type: Epidural  Patient location: Mother/Baby  Post pain: Pain level controlled  Post assessment: Post-op Vital signs reviewed  Last Vitals:  Filed Vitals:   04/16/13 0545  BP: 119/60  Pulse: 65  Temp: 36.9 C  Resp: 18    Post vital signs: Reviewed  Level of consciousness: awake  Complications: No apparent anesthesia complications

## 2013-04-16 NOTE — Discharge Summary (Signed)
Obstetric Discharge Summary Reason for Admission: rupture of membranes Prenatal Procedures: none Intrapartum Procedures: spontaneous vaginal delivery and tubal ligation Postpartum Procedures: none Complications-Operative and Postpartum: none Hemoglobin  Date Value Range Status  04/14/2013 10.1* 12.0 - 15.0 g/dL Final     HCT  Date Value Range Status  04/14/2013 30.4* 36.0 - 46.0 % Final    Physical Exam:  General: alert, cooperative and no distress Lochia: appropriate Uterine Fundus: firm Incision: umbilical dressing clean, dry, and intact DVT Evaluation: No evidence of DVT seen on physical exam. Negative Homan's sign. No cords or calf tenderness.  Discharge Diagnoses: preterm pregnancy, delivered, tubal ligation  Discharge Information: Date: 04/16/2013 Activity: pelvic rest Diet: routine Medications: PNV, Ibuprofen, Colace, Iron and Percocet Condition: stable Instructions: refer to practice specific booklet Discharge to: home  Hospital Course: Melinda Valenzuela is a 22 y.o. [id number] presenting at [redacted]w[redacted]d with PPROM. She was started on pitocin and progressed to complete dilation without complication. She had a normal spontaneous vaginal delivery and underwent bilateral tubal ligation on postpartum day 1. She had an uneventful postpartum/postoperative course.  Newborn Data: Live born female  Birth Weight: 5 lb 13 oz (2637 g) APGAR: 9, 9  Home with mother per pediatricians.  Levert Feinstein 04/16/2013, 7:47 AM  .I have seen the patient with the resident/student and agree with the above.  Tawnya Crook

## 2013-04-16 NOTE — Discharge Summary (Signed)
Attestation of Attending Supervision of Advanced Practitioner (PA/CNM/NP): Evaluation and management procedures were performed by the Advanced Practitioner under my supervision and collaboration.  I have reviewed the Advanced Practitioner's note and chart, and I agree with the management and plan.  Yared Barefoot, MD, FACOG Attending Obstetrician & Gynecologist Faculty Practice, [facility]  

## 2013-04-18 ENCOUNTER — Encounter: Payer: Self-pay | Admitting: *Deleted

## 2013-04-18 ENCOUNTER — Encounter: Payer: Medicaid Other | Admitting: Obstetrics & Gynecology

## 2013-04-25 ENCOUNTER — Telehealth: Payer: Self-pay | Admitting: Adult Health

## 2013-04-25 NOTE — Telephone Encounter (Signed)
Has ? Hemorrhoids, has bubble beside rectum, to come in 5/2 at 12:30 pm

## 2013-04-26 ENCOUNTER — Ambulatory Visit (INDEPENDENT_AMBULATORY_CARE_PROVIDER_SITE_OTHER): Payer: Medicaid Other | Admitting: Adult Health

## 2013-04-26 ENCOUNTER — Encounter: Payer: Self-pay | Admitting: Adult Health

## 2013-04-26 VITALS — BP 110/76 | Ht 67.0 in | Wt 240.0 lb

## 2013-04-26 DIAGNOSIS — K649 Unspecified hemorrhoids: Secondary | ICD-10-CM

## 2013-04-26 DIAGNOSIS — R197 Diarrhea, unspecified: Secondary | ICD-10-CM

## 2013-04-26 HISTORY — DX: Unspecified hemorrhoids: K64.9

## 2013-04-26 MED ORDER — HYDROCORTISONE ACETATE 25 MG RE SUPP: 25.0000 mg | Freq: Two times a day (BID) | RECTAL | Status: DC

## 2013-04-26 NOTE — Patient Instructions (Addendum)
Use warm baths Anusol supp Imodium AD Call Monday in follow up

## 2013-04-26 NOTE — Progress Notes (Signed)
Subjective:     Patient ID: Melinda Valenzuela, female   DOB: 1991-12-05, 22 y.o.   MRN: [id number]  HPI Melinda Valenzuela is a 22 year old black female, who delivered 04/14/13 vaginally, in today complaining of ? Hemorroid, has pain in "butt" and has has loose stool   Review of Systems Positive as in HPI Reviewed past medical,surgical, social and family history. Reviewed medications and allergies.     Objective:   Physical Exam Blood pressure 110/76, height 5\' 7"  (1.702 m), weight 240 lb (108.863 kg), last menstrual period 08/05/2012. Rectal exam: +internal hemorrhoid, some blood on finger, tender to touch, does not feel to have clot in it.    Assessment:      Hemorrhoid    Plan:      Rx Anusol HC supp 1 bid Use warm bath or water bottle Imodium AD Follow up with call on Monday

## 2013-05-27 ENCOUNTER — Ambulatory Visit: Payer: Medicaid Other | Admitting: Women's Health

## 2013-05-28 ENCOUNTER — Ambulatory Visit (INDEPENDENT_AMBULATORY_CARE_PROVIDER_SITE_OTHER): Payer: Medicaid Other | Admitting: Women's Health

## 2013-05-28 ENCOUNTER — Encounter: Payer: Self-pay | Admitting: Women's Health

## 2013-05-28 VITALS — BP 146/80 | Ht 67.0 in | Wt 234.2 lb

## 2013-05-28 DIAGNOSIS — Z3480 Encounter for supervision of other normal pregnancy, unspecified trimester: Secondary | ICD-10-CM

## 2013-05-28 NOTE — Patient Instructions (Signed)
Genital Herpes  Genital herpes is a sexually transmitted disease. This means that it is a disease passed by having sex with an infected person. There is no cure for genital herpes. The time between attacks can be months to years. The virus may live in a person but produce no problems (symptoms). This infection can be passed to a baby as it travels down the birth canal (vagina). In a newborn, this can cause central nervous system damage, eye damage, or even death. The virus that causes genital herpes is usually HSV-2 virus. The virus that causes oral herpes is usually HSV-1. The diagnosis (learning what is wrong) is made through culture results.  SYMPTOMS   Usually symptoms of pain and itching begin a few days to a week after contact. It first appears as small blisters that progress to small painful ulcers which then scab over and heal after several days. It affects the outer genitalia, birth canal, cervix, penis, anal area, buttocks, and thighs.  HOME CARE INSTRUCTIONS   · Keep ulcerated areas dry and clean.  · Take medications as directed. Antiviral medications can speed up healing. They will not prevent recurrences or cure this infection. These medications can also be taken for suppression if there are frequent recurrences.  · While the infection is active, it is contagious. Avoid all sexual contact during active infections.  · Condoms may help prevent spread of the herpes virus.  · Practice safe sex.  · Wash your hands thoroughly after touching the genital area.  · Avoid touching your eyes after touching your genital area.  · Inform your caregiver if you have had genital herpes and become pregnant. It is your responsibility to insure a safe outcome for your baby in this pregnancy.  · Only take over-the-counter or prescription medicines for pain, discomfort, or fever as directed by your caregiver.  SEEK MEDICAL CARE IF:   · You have a recurrence of this infection.  · You do not respond to medications and are not  improving.  · You have new sources of pain or discharge which have changed from the original infection.  · You have an oral temperature above 102° F (38.9° C).  · You develop abdominal pain.  · You develop eye pain or signs of eye infection.  Document Released: 12/09/2000 Document Revised: 03/05/2012 Document Reviewed: 12/30/2009  Exit[employer]® Patient Information ©2014 [employer].

## 2013-05-28 NOTE — Progress Notes (Signed)
Subjective:    Melinda Valenzuela is a 22 y.o. [phone] African American female who presents for a postpartum visit. She is 6 weeks postpartum following a spontaneous vaginal delivery. I have fully reviewed the prenatal and intrapartum course. The delivery was at 36.0 gestational weeks. Outcome: spontaneous vaginal delivery. Anesthesia: epidural. Postpartum course has been uncomplicated. Baby's course has been uncomplicated. Baby is feeding by bottle. Bleeding no bleeding. Bowel function is normal. Bladder function is normal. Patient is not sexually active. Contraception method is tubal ligation. Postpartum depression screening: negative. Denies ha, scotomata, ruq/epigastric pain, n/v.   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  Review of Systems Pertinent items are noted in HPI.   Filed Vitals:   05/28/13 1135  BP: 146/80  Height: 5\' 7"  (1.702 m)  Weight: 234 lb 3.2 oz (106.232 kg)    Objective:     General:  alert, cooperative and no distress   Breasts:  deferred, no complaints  Lungs: clear to auscultation bilaterally  Heart:  regular rate and rhythm  Abdomen: soft, nontender   Vulva: normal  Vagina: normal vagina  Cervix:  closed  Corpus: Well-involuted  Adnexa:  Non-palpable  Rectal Exam: no hemorrhoids        Assessment:   Normal postpartum exam 6 wks s/p SVD and BTL Depression screening Discussed HSV2 and info given on AVS   Plan:   Contraception: s/p BTL Follow up in: 6 months for pap & physical or as needed.

## 2013-06-04 ENCOUNTER — Encounter: Payer: Self-pay | Admitting: *Deleted

## 2013-06-27 ENCOUNTER — Telehealth: Payer: Self-pay | Admitting: Adult Health

## 2013-06-27 ENCOUNTER — Ambulatory Visit: Payer: Medicaid Other | Admitting: Adult Health

## 2013-06-27 NOTE — Telephone Encounter (Signed)
Complains of hemorrhoid, huge to come in at 3 pm to be seen.

## 2013-06-27 NOTE — Telephone Encounter (Signed)
Pt requesting refill for Hemorrhoid medications, unable to reach pt by phone.

## 2013-10-12 ENCOUNTER — Emergency Department (HOSPITAL_COMMUNITY)
Admission: EM | Admit: 2013-10-12 | Discharge: 2013-10-12 | Disposition: A | Discharge status: Home / Self Care | Payer: Medicaid Other | Attending: Emergency Medicine | Admitting: Emergency Medicine

## 2013-10-12 ENCOUNTER — Encounter (HOSPITAL_COMMUNITY): Payer: Self-pay | Admitting: Emergency Medicine

## 2013-10-12 ENCOUNTER — Emergency Department (HOSPITAL_COMMUNITY): Payer: Medicaid Other

## 2013-10-12 DIAGNOSIS — S239XXA Sprain of unspecified parts of thorax, initial encounter: Secondary | ICD-10-CM | POA: Insufficient documentation

## 2013-10-12 DIAGNOSIS — Z8619 Personal history of other infectious and parasitic diseases: Secondary | ICD-10-CM | POA: Insufficient documentation

## 2013-10-12 DIAGNOSIS — X500XXA Overexertion from strenuous movement or load, initial encounter: Secondary | ICD-10-CM | POA: Insufficient documentation

## 2013-10-12 DIAGNOSIS — Y990 Civilian activity done for income or pay: Secondary | ICD-10-CM | POA: Insufficient documentation

## 2013-10-12 DIAGNOSIS — Y9389 Activity, other specified: Secondary | ICD-10-CM | POA: Insufficient documentation

## 2013-10-12 DIAGNOSIS — Z8679 Personal history of other diseases of the circulatory system: Secondary | ICD-10-CM | POA: Insufficient documentation

## 2013-10-12 DIAGNOSIS — Y9289 Other specified places as the place of occurrence of the external cause: Secondary | ICD-10-CM | POA: Insufficient documentation

## 2013-10-12 DIAGNOSIS — S29019A Strain of muscle and tendon of unspecified wall of thorax, initial encounter: Secondary | ICD-10-CM

## 2013-10-12 DIAGNOSIS — Z87891 Personal history of nicotine dependence: Secondary | ICD-10-CM | POA: Insufficient documentation

## 2013-10-12 MED ORDER — IBUPROFEN 600 MG PO TABS: 600.0000 mg | ORAL_TABLET | Freq: Three times a day (TID) | ORAL | Status: DC | PRN

## 2013-10-12 MED ORDER — CYCLOBENZAPRINE HCL 5 MG PO TABS: 5.0000 mg | ORAL_TABLET | Freq: Three times a day (TID) | ORAL | Status: DC | PRN

## 2013-10-12 NOTE — ED Notes (Signed)
Pt reports back pain since Tuesday, was bending over and felt pop in her back. Not getting any better

## 2013-10-12 NOTE — ED Provider Notes (Signed)
Medical screening examination/treatment/procedure(s) were performed by non-physician practitioner and as supervising physician I was immediately available for consultation/collaboration.  Lyanne Co, MD 10/12/13 [phone]

## 2013-10-12 NOTE — ED Provider Notes (Signed)
CSN: [id number]     Arrival date & time 10/12/13  1458 History   First MD Initiated Contact with Patient 10/12/13 1608     Chief Complaint  Patient presents with  . Back Pain   (Consider location/radiation/quality/duration/timing/severity/associated sxs/prior Treatment) Patient is a 22 y.o. female presenting with back pain. The history is provided by the patient.  Back Pain Location:  Thoracic spine Quality:  Cramping and shooting Radiates to: radiates to left lateral back. Pain severity:  Moderate Pain is:  Same all the time Onset quality:  Sudden (she bent over to pick up a tea bag at work 4 days ago and felt a sudden "pop" in her mid back) Duration:  4 days Timing:  Constant Progression:  Unchanged Chronicity:  New Context: occupational injury   Context: not falling, not lifting heavy objects, not recent injury and not twisting   Relieved by:  Lying down and being still Worsened by:  Bending, palpation and movement Ineffective treatments: bayer back and body pain reliever. Associated symptoms: no abdominal pain, no bladder incontinence, no bowel incontinence, no chest pain, no dysuria, no fever, no headaches, no numbness, no paresthesias, no tingling and no weakness   Risk factors: no hx of cancer, not pregnant and no steroid use     Past Medical History  Diagnosis Date  . HSV-2 (herpes simplex virus 2) infection   . Abnormal Pap smear   . Headache(784.0)   . Chlamydia   . Hemorrhoids 04/26/2013   Past Surgical History  Procedure Laterality Date  . Dilation and curettage of uterus    . Tubal ligation Bilateral 04/15/2013    Procedure: POST PARTUM TUBAL LIGATION;  Surgeon: Catalina Antigua, MD;  Location: [facility];  Service: Gynecology;  Laterality: Bilateral;   Family History  Problem Relation Age of Onset  . Cancer Other     colon  . Stroke Maternal Grandfather   . Cancer Father     colon  . Stroke Mother   . Hypertension Mother    History  Substance Use Topics  .  Smoking status: Former Smoker -- 0.50 packs/day    Types: Cigarettes  . Smokeless tobacco: Former Neurosurgeon    Quit date: 01/29/2012  . Alcohol Use: Yes     Comment: occasionally liquor   OB History   Grav Para Term Preterm Abortions TAB SAB Ect Mult Living   4 3 2 1 1  0 0 0 0 3     Review of Systems  Constitutional: Negative for fever.  Respiratory: Negative for shortness of breath.   Cardiovascular: Negative for chest pain and leg swelling.  Gastrointestinal: Negative for abdominal pain, constipation, abdominal distention and bowel incontinence.  Genitourinary: Negative for bladder incontinence, dysuria, urgency, frequency, flank pain and difficulty urinating.  Musculoskeletal: Positive for back pain. Negative for gait problem and joint swelling.  Skin: Negative for rash.  Neurological: Negative for tingling, weakness, numbness, headaches and paresthesias.    Allergies  Review of patient's allergies indicates no known allergies.  Home Medications   Current Outpatient Rx  Name  Route  Sig  Dispense  Refill  . Aspirin-Caffeine (BAYER BACK & BODY PAIN EX ST) 500-32.5 MG TABS   Oral   Take 2 tablets by mouth as needed (for pain).         . cyclobenzaprine (FLEXERIL) 5 MG tablet   Oral   Take 1 tablet (5 mg total) by mouth 3 (three) times daily as needed for muscle spasms.   15 tablet  0   . ibuprofen (ADVIL,MOTRIN) 600 MG tablet   Oral   Take 1 tablet (600 mg total) by mouth every 8 (eight) hours as needed for pain.   15 tablet   0    BP 150/92  Pulse 91  Temp(Src) 98.5 F (36.9 C) (Oral)  Resp 18  Ht 5\' 7"  (1.702 m)  Wt 218 lb 4 oz (98.998 kg)  BMI 34.17 kg/m2  SpO2 100%  LMP 09/28/2013  Breastfeeding? No Physical Exam  Nursing note and vitals reviewed. Constitutional: She appears well-developed and well-nourished.  HENT:  Head: Normocephalic.  Eyes: Conjunctivae are normal.  Neck: Normal range of motion. Neck supple.  Cardiovascular: Normal rate and  intact distal pulses.   Pedal pulses normal.  Pulmonary/Chest: Effort normal.  Abdominal: Soft. Bowel sounds are normal. She exhibits no distension and no mass.  Musculoskeletal: Normal range of motion. She exhibits tenderness. She exhibits no edema.       Thoracic back: She exhibits tenderness, bony tenderness and spasm. She exhibits no swelling and no edema.       Lumbar back: She exhibits no tenderness, no swelling, no edema and no spasm.       Back:  Neurological: She is alert. She has normal strength. She displays no atrophy and no tremor. No sensory deficit. Gait normal.  Reflex Scores:      Patellar reflexes are 2+ on the right side and 2+ on the left side.      Achilles reflexes are 2+ on the right side and 2+ on the left side. No strength deficit noted in hip and knee flexor and extensor muscle groups.  Ankle flexion and extension intact.  Skin: Skin is warm and dry.  Psychiatric: She has a normal mood and affect.    ED Course  Procedures (including critical care time) Labs Review Labs Reviewed - No data to display Imaging Review Dg Thoracic Spine 2 View  10/12/2013   CLINICAL DATA:  Back pain.  EXAM: THORACIC SPINE - 2 VIEW  COMPARISON:  CHEST x-ray 12/30/2011  FINDINGS: There is no evidence of thoracic spine fracture. Alignment is normal. No other significant bone abnormalities are identified.  IMPRESSION: Negative.   Electronically Signed   By: Charlett Nose M.D.   On: 10/12/2013 17:03    EKG Interpretation   None       MDM   1. Thoracic myofascial strain, initial encounter    Patients labs and/or radiological studies were viewed and considered during the medical decision making and disposition process. Pt prescribed ibuprofen 600 mg,  Flexeril,  Encouraged rest,  Heat tx.  Prn f/u,  Referrals given.    Burgess Amor, PA-C 10/12/13 [phone]

## 2013-10-12 NOTE — ED Notes (Signed)
Pt presents with lower back pain after lifting something at work. Pt denies bowel and bladder and dysfunction. Pedal pulses present. NAD noted.

## 2013-10-31 ENCOUNTER — Other Ambulatory Visit: Payer: Self-pay

## 2014-01-16 ENCOUNTER — Other Ambulatory Visit (HOSPITAL_COMMUNITY)
Admission: RE | Admit: 2014-01-16 | Discharge: 2014-01-16 | Disposition: A | Discharge status: Home / Self Care | Payer: Medicaid Other | Source: Ambulatory Visit | Attending: Obstetrics & Gynecology | Admitting: Obstetrics & Gynecology

## 2014-01-16 ENCOUNTER — Encounter: Payer: Self-pay | Admitting: Obstetrics & Gynecology

## 2014-01-16 ENCOUNTER — Ambulatory Visit (INDEPENDENT_AMBULATORY_CARE_PROVIDER_SITE_OTHER): Payer: Medicaid Other | Admitting: Obstetrics & Gynecology

## 2014-01-16 VITALS — BP 130/80 | Ht 67.0 in | Wt 201.0 lb

## 2014-01-16 DIAGNOSIS — Z0000 Encounter for general adult medical examination without abnormal findings: Secondary | ICD-10-CM

## 2014-01-16 DIAGNOSIS — Z01419 Encounter for gynecological examination (general) (routine) without abnormal findings: Secondary | ICD-10-CM | POA: Insufficient documentation

## 2014-01-16 NOTE — Progress Notes (Signed)
Patient ID: Melinda Valenzuela, female   DOB: 05-09-1991, 23 y.o.   MRN: [id number] Subjective:     Melinda Valenzuela is a 23 y.o. female here for a routine exam.  Patient's last menstrual period was 12/19/2013. [id number] Birth Control Method:  btl Menstrual Calendar(currently): regular  Current complaints: none.   Current acute medical issues:  None, had lap chole 2 weeks ago   Recent Gynecologic History Patient's last menstrual period was 12/19/2013. Last Pap: 2013,  normal Last mammogram: ,    Past Medical History  Diagnosis Date  . HSV-2 (herpes simplex virus 2) infection   . Abnormal Pap smear   . Headache(784.0)   . Chlamydia   . Hemorrhoids 04/26/2013    Past Surgical History  Procedure Laterality Date  . Dilation and curettage of uterus    . Tubal ligation Bilateral 04/15/2013    Procedure: POST PARTUM TUBAL LIGATION;  Surgeon: Catalina Antigua, MD;  Location: [facility];  Service: Gynecology;  Laterality: Bilateral;  . Cholecystectomy      OB History   Grav Para Term Preterm Abortions TAB SAB Ect Mult Living   4 3 2 1 1  0 0 0 0 3      History   Social History  . Marital Status: Married    Spouse Name: N/A    Number of Children: N/A  . Years of Education: N/A   Social History Main Topics  . Smoking status: Current Every Day Smoker -- 0.50 packs/day    Types: Cigarettes  . Smokeless tobacco: Former Neurosurgeon    Quit date: 01/29/2012  . Alcohol Use: Yes     Comment: occasionally liquor  . Drug Use: Yes    Special: Marijuana  . Sexual Activity: Not Currently    Birth Control/ Protection: Surgical   Other Topics Concern  . None   Social History Narrative  . None    Family History  Problem Relation Age of Onset  . Cancer Other     colon  . Stroke Maternal Grandfather   . Cancer Father     colon  . Stroke Mother   . Hypertension Mother      Review of Systems  Review of Systems  Constitutional: Negative for fever, chills, weight loss,  malaise/fatigue and diaphoresis.  HENT: Negative for hearing loss, ear pain, nosebleeds, congestion, sore throat, neck pain, tinnitus and ear discharge.   Eyes: Negative for blurred vision, double vision, photophobia, pain, discharge and redness.  Respiratory: Negative for cough, hemoptysis, sputum production, shortness of breath, wheezing and stridor.   Cardiovascular: Negative for chest pain, palpitations, orthopnea, claudication, leg swelling and PND.  Gastrointestinal: negative for abdominal pain. Negative for heartburn, nausea, vomiting, diarrhea, constipation, blood in stool and melena.  Genitourinary: Negative for dysuria, urgency, frequency, hematuria and flank pain.  Musculoskeletal: Negative for myalgias, back pain, joint pain and falls.  Skin: Negative for itching and rash.  Neurological: Negative for dizziness, tingling, tremors, sensory change, speech change, focal weakness, seizures, loss of consciousness, weakness and headaches.  Endo/Heme/Allergies: Negative for environmental allergies and polydipsia. Does not bruise/bleed easily.  Psychiatric/Behavioral: Negative for depression, suicidal ideas, hallucinations, memory loss and substance abuse. The patient is not nervous/anxious and does not have insomnia.        Objective:    Physical Exam  Vitals reviewed. Constitutional: She is oriented to person, place, and time. She appears well-developed and well-nourished.  HENT:  Head: Normocephalic and atraumatic.        Right Ear:  External ear normal.  Left Ear: External ear normal.  Nose: Nose normal.  Mouth/Throat: Oropharynx is clear and moist.  Eyes: Conjunctivae and EOM are normal. Pupils are equal, round, and reactive to light. Right eye exhibits no discharge. Left eye exhibits no discharge. No scleral icterus.  Neck: Normal range of motion. Neck supple. No tracheal deviation present. No thyromegaly present.  Cardiovascular: Normal rate, regular rhythm, normal heart sounds  and intact distal pulses.  Exam reveals no gallop and no friction rub.   No murmur heard. Respiratory: Effort normal and breath sounds normal. No respiratory distress. She has no wheezes. She has no rales. She exhibits no tenderness.  GI: Soft. Bowel sounds are normal. She exhibits no distension and no mass. There is no tenderness. There is no rebound and no guarding.  Genitourinary:  Breasts no masses skin changes or nipple changes bilaterally      Vulva is normal without lesions Vagina is pink moist without discharge Cervix normal in appearance and pap is done Uterus is normal size shape and contour Adnexa is negative with normal sized ovaries    Musculoskeletal: Normal range of motion. She exhibits no edema and no tenderness.  Neurological: She is alert and oriented to person, place, and time. She has normal reflexes. She displays normal reflexes. No cranial nerve deficit. She exhibits normal muscle tone. Coordination normal.  Skin: Skin is warm and dry. No rash noted. No erythema. No pallor.  Psychiatric: She has a normal mood and affect. Her behavior is normal. Judgment and thought content normal.       Assessment:    Healthy female exam.    Plan:    Contraception: tubal ligation. Follow up in: 1 year.

## 2014-01-21 ENCOUNTER — Telehealth: Payer: Self-pay | Admitting: *Deleted

## 2014-01-21 NOTE — Telephone Encounter (Signed)
Spoke with pt letting her know she needs a colposcopy. Explained procedure and why she needed it. Call transferred to front desk to schedule an appt. Pt voiced understanding. JSY

## 2014-01-28 ENCOUNTER — Encounter: Payer: Medicaid Other | Admitting: Obstetrics & Gynecology

## 2014-02-06 ENCOUNTER — Encounter: Payer: Medicaid Other | Admitting: Obstetrics & Gynecology

## 2014-04-22 ENCOUNTER — Ambulatory Visit (INDEPENDENT_AMBULATORY_CARE_PROVIDER_SITE_OTHER): Payer: Medicaid Other | Admitting: Obstetrics & Gynecology

## 2014-04-22 ENCOUNTER — Encounter: Payer: Self-pay | Admitting: Obstetrics & Gynecology

## 2014-04-22 VITALS — BP 120/80 | Ht 67.0 in | Wt 196.0 lb

## 2014-04-22 DIAGNOSIS — N760 Acute vaginitis: Secondary | ICD-10-CM

## 2014-04-22 DIAGNOSIS — Z113 Encounter for screening for infections with a predominantly sexual mode of transmission: Secondary | ICD-10-CM

## 2014-04-22 DIAGNOSIS — N870 Mild cervical dysplasia: Secondary | ICD-10-CM | POA: Insufficient documentation

## 2014-04-22 MED ORDER — METRONIDAZOLE 500 MG PO TABS: ORAL_TABLET | ORAL | Status: DC

## 2014-04-22 NOTE — Progress Notes (Signed)
Patient ID: Rockie NeighboursChauncey L Williamson, female   DOB: May 02, 1991, 23 y.o.   MRN: [id number] Pap  LSIL  On 01/16/2014  Colposcopy: Adequate acetowhite changes     negative Punctation                    negative Mosaicism                    negative abnomrla vessels         Negative  Biopsy  Not taken  Imp Normal colposcopy  Repeat pap next January  Wet prep  +BV  Metronidazole 500 BID  Past Medical History  Diagnosis Date  . HSV-2 (herpes simplex virus 2) infection   . Abnormal Pap smear   . Headache(784.0)   . Chlamydia   . Hemorrhoids 04/26/2013    Past Surgical History  Procedure Laterality Date  . Dilation and curettage of uterus    . Tubal ligation Bilateral 04/15/2013    Procedure: POST PARTUM TUBAL LIGATION;  Surgeon: Catalina AntiguaPeggy Constant, MD;  Location: [facility];  Service: Gynecology;  Laterality: Bilateral;  . Cholecystectomy      OB History   Grav Para Term Preterm Abortions TAB SAB Ect Mult Living   4 3 2 1 1  0 0 0 0 3      No Known Allergies  History   Social History  . Marital Status: Married    Spouse Name: N/A    Number of Children: N/A  . Years of Education: N/A   Social History Main Topics  . Smoking status: Current Every Day Smoker -- 0.50 packs/day    Types: Cigarettes  . Smokeless tobacco: Former NeurosurgeonUser    Quit date: 01/29/2012  . Alcohol Use: Yes     Comment: occasionally liquor  . Drug Use: Yes    Special: Marijuana  . Sexual Activity: Not Currently    Birth Control/ Protection: Surgical   Other Topics Concern  . None   Social History Narrative  . None    Family History  Problem Relation Age of Onset  . Cancer Other     colon  . Stroke Maternal Grandfather   . Cancer Father     colon  . Stroke Mother   . Hypertension Mother

## 2014-04-22 NOTE — Addendum Note (Signed)
Addended by: Criss AlvinePULLIAM, Jameka Ivie G on: 04/22/2014 12:01 PM   Modules accepted: Orders

## 2014-04-23 LAB — GC/CHLAMYDIA PROBE AMP
CT PROBE, AMP APTIMA: NEGATIVE
GC PROBE AMP APTIMA: NEGATIVE

## 2014-06-26 ENCOUNTER — Encounter (HOSPITAL_COMMUNITY): Payer: Self-pay | Admitting: Emergency Medicine

## 2014-06-26 ENCOUNTER — Emergency Department (HOSPITAL_COMMUNITY)
Admission: EM | Admit: 2014-06-26 | Discharge: 2014-06-26 | Disposition: A | Discharge status: Home / Self Care | Payer: Medicaid Other | Attending: Emergency Medicine | Admitting: Emergency Medicine

## 2014-06-26 DIAGNOSIS — Z8619 Personal history of other infectious and parasitic diseases: Secondary | ICD-10-CM | POA: Insufficient documentation

## 2014-06-26 DIAGNOSIS — F17200 Nicotine dependence, unspecified, uncomplicated: Secondary | ICD-10-CM | POA: Insufficient documentation

## 2014-06-26 DIAGNOSIS — S46909A Unspecified injury of unspecified muscle, fascia and tendon at shoulder and upper arm level, unspecified arm, initial encounter: Secondary | ICD-10-CM | POA: Diagnosis present

## 2014-06-26 DIAGNOSIS — Y92410 Unspecified street and highway as the place of occurrence of the external cause: Secondary | ICD-10-CM | POA: Diagnosis not present

## 2014-06-26 DIAGNOSIS — IMO0002 Reserved for concepts with insufficient information to code with codable children: Secondary | ICD-10-CM | POA: Diagnosis not present

## 2014-06-26 DIAGNOSIS — S139XXA Sprain of joints and ligaments of unspecified parts of neck, initial encounter: Secondary | ICD-10-CM | POA: Insufficient documentation

## 2014-06-26 DIAGNOSIS — Y9389 Activity, other specified: Secondary | ICD-10-CM | POA: Diagnosis not present

## 2014-06-26 DIAGNOSIS — Z8679 Personal history of other diseases of the circulatory system: Secondary | ICD-10-CM | POA: Diagnosis not present

## 2014-06-26 DIAGNOSIS — S4980XA Other specified injuries of shoulder and upper arm, unspecified arm, initial encounter: Secondary | ICD-10-CM | POA: Diagnosis present

## 2014-06-26 MED ORDER — IBUPROFEN 600 MG PO TABS: 600.0000 mg | ORAL_TABLET | Freq: Four times a day (QID) | ORAL | Status: DC | PRN

## 2014-06-26 MED ORDER — METHOCARBAMOL 500 MG PO TABS
1000.0000 mg | ORAL_TABLET | Freq: Once | ORAL | Status: AC
  Administered 2014-06-26: 1000 mg via ORAL
  Filled 2014-06-26: qty 2

## 2014-06-26 MED ORDER — METHOCARBAMOL 500 MG PO TABS: 1000.0000 mg | ORAL_TABLET | Freq: Four times a day (QID) | ORAL | Status: AC | PRN

## 2014-06-26 MED ORDER — IBUPROFEN 800 MG PO TABS
800.0000 mg | ORAL_TABLET | Freq: Once | ORAL | Status: AC
  Administered 2014-06-26: 800 mg via ORAL
  Filled 2014-06-26: qty 1
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
## 2014-07-01 NOTE — ED Provider Notes (Signed)
Medical screening examination/treatment/procedure(s) were performed by non-physician practitioner and as supervising physician I was immediately available for consultation/collaboration.   EKG Interpretation None        Josephine Wooldridge, MD 07/01/14 0343 

## 2014-10-21 ENCOUNTER — Emergency Department (HOSPITAL_COMMUNITY)
Admission: EM | Admit: 2014-10-21 | Discharge: 2014-10-21 | Disposition: A | Discharge status: Home / Self Care | Payer: Medicaid Other | Attending: Emergency Medicine | Admitting: Emergency Medicine

## 2014-10-21 ENCOUNTER — Encounter (HOSPITAL_COMMUNITY): Payer: Self-pay | Admitting: Emergency Medicine

## 2014-10-21 DIAGNOSIS — Z720 Tobacco use: Secondary | ICD-10-CM | POA: Diagnosis not present

## 2014-10-21 DIAGNOSIS — K529 Noninfective gastroenteritis and colitis, unspecified: Secondary | ICD-10-CM | POA: Diagnosis not present

## 2014-10-21 DIAGNOSIS — Z8619 Personal history of other infectious and parasitic diseases: Secondary | ICD-10-CM | POA: Diagnosis not present

## 2014-10-21 DIAGNOSIS — L728 Other follicular cysts of the skin and subcutaneous tissue: Secondary | ICD-10-CM | POA: Insufficient documentation

## 2014-10-21 DIAGNOSIS — L729 Follicular cyst of the skin and subcutaneous tissue, unspecified: Secondary | ICD-10-CM

## 2014-10-21 DIAGNOSIS — R112 Nausea with vomiting, unspecified: Secondary | ICD-10-CM | POA: Diagnosis present

## 2014-10-21 LAB — CBC WITH DIFFERENTIAL/PLATELET
BASOS ABS: 0 10*3/uL (ref 0.0–0.1)
BASOS PCT: 0 % (ref 0–1)
EOS ABS: 0.4 10*3/uL (ref 0.0–0.7)
Eosinophils Relative: 6 % — ABNORMAL HIGH (ref 0–5)
HCT: 32.7 % — ABNORMAL LOW (ref 36.0–46.0)
Hemoglobin: 11 g/dL — ABNORMAL LOW (ref 12.0–15.0)
Lymphocytes Relative: 24 % (ref 12–46)
Lymphs Abs: 1.8 10*3/uL (ref 0.7–4.0)
MCH: 29.1 pg (ref 26.0–34.0)
MCHC: 33.6 g/dL (ref 30.0–36.0)
MCV: 86.5 fL (ref 78.0–100.0)
Monocytes Absolute: 0.7 10*3/uL (ref 0.1–1.0)
Monocytes Relative: 10 % (ref 3–12)
NEUTROS ABS: 4.4 10*3/uL (ref 1.7–7.7)
NEUTROS PCT: 60 % (ref 43–77)
Platelets: 280 10*3/uL (ref 150–400)
RBC: 3.78 MIL/uL — ABNORMAL LOW (ref 3.87–5.11)
RDW: 15.8 % — AB (ref 11.5–15.5)
WBC: 7.3 10*3/uL (ref 4.0–10.5)

## 2014-10-21 LAB — COMPREHENSIVE METABOLIC PANEL
ALBUMIN: 3.3 g/dL — AB (ref 3.5–5.2)
ALK PHOS: 73 U/L (ref 39–117)
ALT: 8 U/L (ref 0–35)
AST: 13 U/L (ref 0–37)
Anion gap: 11 (ref 5–15)
BILIRUBIN TOTAL: 0.8 mg/dL (ref 0.3–1.2)
BUN: 9 mg/dL (ref 6–23)
CHLORIDE: 105 meq/L (ref 96–112)
CO2: 24 mEq/L (ref 19–32)
CREATININE: 0.64 mg/dL (ref 0.50–1.10)
Calcium: 8.8 mg/dL (ref 8.4–10.5)
GFR calc Af Amer: >90 mL/min (ref >90)
GFR calc non Af Amer: >90 mL/min (ref >90)
Glucose, Bld: 78 mg/dL (ref 70–99)
POTASSIUM: 3.8 meq/L (ref 3.7–5.3)
Sodium: 140 mEq/L (ref 137–147)
TOTAL PROTEIN: 7.1 g/dL (ref 6.0–8.3)

## 2014-10-21 LAB — URINALYSIS, ROUTINE W REFLEX MICROSCOPIC
Bilirubin Urine: NEGATIVE
Glucose, UA: NEGATIVE mg/dL
Hgb urine dipstick: NEGATIVE
KETONES UR: NEGATIVE mg/dL
LEUKOCYTES UA: NEGATIVE
NITRITE: NEGATIVE
PROTEIN: NEGATIVE mg/dL
Specific Gravity, Urine: 1.025 (ref 1.005–1.030)
UROBILINOGEN UA: 1 mg/dL (ref 0.0–1.0)
pH: 6.5 (ref 5.0–8.0)

## 2014-10-21 MED ORDER — DOXYCYCLINE HYCLATE 100 MG PO CAPS: 100.0000 mg | ORAL_CAPSULE | Freq: Two times a day (BID) | ORAL | Status: DC

## 2014-10-21 MED ORDER — SODIUM CHLORIDE 0.9 % IV BOLUS (SEPSIS)
1000.0000 mL | Freq: Once | INTRAVENOUS | Status: AC
  Administered 2014-10-21: 1000 mL via INTRAVENOUS

## 2014-10-21 NOTE — ED Notes (Signed)
PT c/o n/v/d x1 day and also states she has a hard area to the back of her scalp and thinks its a cyst.
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
## 2014-10-21 NOTE — Discharge Instructions (Signed)
Follow up with Dr. Suszanne Connerseoh for your cyst on your head.   Follow up with a family md if stomach problems do not improve

## 2014-10-27 ENCOUNTER — Encounter (HOSPITAL_COMMUNITY): Payer: Self-pay | Admitting: Emergency Medicine

## 2015-04-23 ENCOUNTER — Other Ambulatory Visit: Payer: Medicaid Other | Admitting: Obstetrics & Gynecology

## 2015-04-28 ENCOUNTER — Encounter (HOSPITAL_COMMUNITY): Payer: Self-pay

## 2015-04-28 ENCOUNTER — Emergency Department (HOSPITAL_COMMUNITY)
Admission: EM | Admit: 2015-04-28 | Discharge: 2015-04-28 | Disposition: A | Discharge status: Home / Self Care | Payer: 59 | Attending: Emergency Medicine | Admitting: Emergency Medicine

## 2015-04-28 DIAGNOSIS — Z792 Long term (current) use of antibiotics: Secondary | ICD-10-CM | POA: Diagnosis not present

## 2015-04-28 DIAGNOSIS — Z720 Tobacco use: Secondary | ICD-10-CM | POA: Diagnosis not present

## 2015-04-28 DIAGNOSIS — Z8719 Personal history of other diseases of the digestive system: Secondary | ICD-10-CM | POA: Insufficient documentation

## 2015-04-28 DIAGNOSIS — Z8619 Personal history of other infectious and parasitic diseases: Secondary | ICD-10-CM | POA: Insufficient documentation

## 2015-04-28 DIAGNOSIS — R51 Headache: Secondary | ICD-10-CM | POA: Insufficient documentation

## 2015-04-28 DIAGNOSIS — R519 Headache, unspecified: Secondary | ICD-10-CM

## 2015-04-28 LAB — BASIC METABOLIC PANEL
Anion gap: 7 (ref 5–15)
BUN: 9 mg/dL (ref 6–20)
CO2: 25 mmol/L (ref 22–32)
Calcium: 8.5 mg/dL — ABNORMAL LOW (ref 8.9–10.3)
Chloride: 107 mmol/L (ref 101–111)
Creatinine, Ser: 0.61 mg/dL (ref 0.44–1.00)
GFR calc Af Amer: >60 mL/min (ref >60)
GFR calc non Af Amer: >60 mL/min (ref >60)
Glucose, Bld: 91 mg/dL (ref 70–99)
Potassium: 3.7 mmol/L (ref 3.5–5.1)
Sodium: 139 mmol/L (ref 135–145)

## 2015-04-28 MED ORDER — SODIUM CHLORIDE 0.9 % IV BOLUS (SEPSIS): 1000.0000 mL | Freq: Once | INTRAVENOUS | Status: DC

## 2015-04-28 MED ORDER — KETOROLAC TROMETHAMINE 30 MG/ML IJ SOLN
15.0000 mg | Freq: Once | INTRAMUSCULAR | Status: AC
  Administered 2015-04-28: 15 mg via INTRAVENOUS
  Filled 2015-04-28: qty 1

## 2015-04-28 MED ORDER — PROCHLORPERAZINE EDISYLATE 5 MG/ML IJ SOLN
10.0000 mg | Freq: Once | INTRAMUSCULAR | Status: AC
  Administered 2015-04-28: 10 mg via INTRAVENOUS
  Filled 2015-04-28: qty 2

## 2015-04-28 MED ORDER — DIPHENHYDRAMINE HCL 50 MG/ML IJ SOLN
25.0000 mg | Freq: Once | INTRAMUSCULAR | Status: AC
  Administered 2015-04-28: 25 mg via INTRAVENOUS
  Filled 2015-04-28: qty 1

## 2015-04-28 NOTE — ED Notes (Signed)
Pt c/o generalized weakness, frequent headaches, seeing dots in vision for the past week.  Reports bp today was 142/90.

## 2015-04-28 NOTE — ED Notes (Signed)
Pt out at desk requesting to leave, EDP made aware and discharge papers given

## 2015-04-28 NOTE — Discharge Instructions (Signed)
Headaches, Frequently Asked Questions °MIGRAINE HEADACHES °Q: What is migraine? What causes it? How can I treat it? °A: Generally, migraine headaches begin as a dull ache. Then they develop into a constant, throbbing, and pulsating pain. You may experience pain at the temples. You may experience pain at the front or back of one or both sides of the head. The pain is usually accompanied by a combination of: °· Nausea. °· Vomiting. °· Sensitivity to light and noise. °Some people (about 15%) experience an aura (see below) before an attack. The cause of migraine is believed to be chemical reactions in the brain. Treatment for migraine may include over-the-counter or prescription medications. It may also include self-help techniques. These include relaxation training and biofeedback.  °Q: What is an aura? °A: About 15% of people with migraine get an "aura". This is a sign of neurological symptoms that occur before a migraine headache. You may see wavy or jagged lines, dots, or flashing lights. You might experience tunnel vision or blind spots in one or both eyes. The aura can include visual or auditory hallucinations (something imagined). It may include disruptions in smell (such as strange odors), taste or touch. Other symptoms include: °· Numbness. °· A "pins and needles" sensation. °· Difficulty in recalling or speaking the correct word. °These neurological events may last as long as 60 minutes. These symptoms will fade as the headache begins. °Q: What is a trigger? °A: Certain physical or environmental factors can lead to or "trigger" a migraine. These include: °· Foods. °· Hormonal changes. °· Weather. °· Stress. °It is important to remember that triggers are different for everyone. To help prevent migraine attacks, you need to figure out which triggers affect you. Keep a headache diary. This is a good way to track triggers. The diary will help you talk to your healthcare professional about your condition. °Q: Does  weather affect migraines? °A: Bright sunshine, hot, humid conditions, and drastic changes in barometric pressure may lead to, or "trigger," a migraine attack in some people. But studies have shown that weather does not act as a trigger for everyone with migraines. °Q: What is the link between migraine and hormones? °A: Hormones start and regulate many of your body's functions. Hormones keep your body in balance within a constantly changing environment. The levels of hormones in your body are unbalanced at times. Examples are during menstruation, pregnancy, or menopause. That can lead to a migraine attack. In fact, about three quarters of all women with migraine report that their attacks are related to the menstrual cycle.  °Q: Is there an increased risk of stroke for migraine sufferers? °A: The likelihood of a migraine attack causing a stroke is very remote. That is not to say that migraine sufferers cannot have a stroke associated with their migraines. In persons under age 40, the most common associated factor for stroke is migraine headache. But over the course of a person's normal life span, the occurrence of migraine headache may actually be associated with a reduced risk of dying from cerebrovascular disease due to stroke.  °Q: What are acute medications for migraine? °A: Acute medications are used to treat the pain of the headache after it has started. Examples over-the-counter medications, NSAIDs, ergots, and triptans.  °Q: What are the triptans? °A: Triptans are the newest class of abortive medications. They are specifically targeted to treat migraine. Triptans are vasoconstrictors. They moderate some chemical reactions in the brain. The triptans work on receptors in your brain. Triptans help   to restore the balance of a neurotransmitter called serotonin. Fluctuations in levels of serotonin are thought to be a main cause of migraine.  °Q: Are over-the-counter medications for migraine effective? °A:  Over-the-counter, or "OTC," medications may be effective in relieving mild to moderate pain and associated symptoms of migraine. But you should see your caregiver before beginning any treatment regimen for migraine.  °Q: What are preventive medications for migraine? °A: Preventive medications for migraine are sometimes referred to as "prophylactic" treatments. They are used to reduce the frequency, severity, and length of migraine attacks. Examples of preventive medications include antiepileptic medications, antidepressants, beta-blockers, calcium channel blockers, and NSAIDs (nonsteroidal anti-inflammatory drugs). °Q: Why are anticonvulsants used to treat migraine? °A: During the past few years, there has been an increased interest in antiepileptic drugs for the prevention of migraine. They are sometimes referred to as "anticonvulsants". Both epilepsy and migraine may be caused by similar reactions in the brain.  °Q: Why are antidepressants used to treat migraine? °A: Antidepressants are typically used to treat people with depression. They may reduce migraine frequency by regulating chemical levels, such as serotonin, in the brain.  °Q: What alternative therapies are used to treat migraine? °A: The term "alternative therapies" is often used to describe treatments considered outside the scope of conventional Western medicine. Examples of alternative therapy include acupuncture, acupressure, and yoga. Another common alternative treatment is herbal therapy. Some herbs are believed to relieve headache pain. Always discuss alternative therapies with your caregiver before proceeding. Some herbal products contain arsenic and other toxins. °TENSION HEADACHES °Q: What is a tension-type headache? What causes it? How can I treat it? °A: Tension-type headaches occur randomly. They are often the result of temporary stress, anxiety, fatigue, or anger. Symptoms include soreness in your temples, a tightening band-like sensation  around your head (a "vice-like" ache). Symptoms can also include a pulling feeling, pressure sensations, and contracting head and neck muscles. The headache begins in your forehead, temples, or the back of your head and neck. Treatment for tension-type headache may include over-the-counter or prescription medications. Treatment may also include self-help techniques such as relaxation training and biofeedback. °CLUSTER HEADACHES °Q: What is a cluster headache? What causes it? How can I treat it? °A: Cluster headache gets its name because the attacks come in groups. The pain arrives with little, if any, warning. It is usually on one side of the head. A tearing or bloodshot eye and a runny nose on the same side of the headache may also accompany the pain. Cluster headaches are believed to be caused by chemical reactions in the brain. They have been described as the most severe and intense of any headache type. Treatment for cluster headache includes prescription medication and oxygen. °SINUS HEADACHES °Q: What is a sinus headache? What causes it? How can I treat it? °A: When a cavity in the bones of the face and skull (a sinus) becomes inflamed, the inflammation will cause localized pain. This condition is usually the result of an allergic reaction, a tumor, or an infection. If your headache is caused by a sinus blockage, such as an infection, you will probably have a fever. An x-ray will confirm a sinus blockage. Your caregiver's treatment might include antibiotics for the infection, as well as antihistamines or decongestants.  °REBOUND HEADACHES °Q: What is a rebound headache? What causes it? How can I treat it? °A: A pattern of taking acute headache medications too often can lead to a condition known as "rebound headache."   A pattern of taking too much headache medication includes taking it more than 2 days per week or in excessive amounts. That means more than the label or a caregiver advises. With rebound  headaches, your medications not only stop relieving pain, they actually begin to cause headaches. Doctors treat rebound headache by tapering the medication that is being overused. Sometimes your caregiver will gradually substitute a different type of treatment or medication. Stopping may be a challenge. Regularly overusing a medication increases the potential for serious side effects. Consult a caregiver if you regularly use headache medications more than 2 days per week or more than the label advises. °ADDITIONAL QUESTIONS AND ANSWERS °Q: What is biofeedback? °A: Biofeedback is a self-help treatment. Biofeedback uses special equipment to monitor your body's involuntary physical responses. Biofeedback monitors: °· Breathing. °· Pulse. °· Heart rate. °· Temperature. °· Muscle tension. °· Brain activity. °Biofeedback helps you refine and perfect your relaxation exercises. You learn to control the physical responses that are related to stress. Once the technique has been mastered, you do not need the equipment any more. °Q: Are headaches hereditary? °A: Four out of five (80%) of people that suffer report a family history of migraine. Scientists are not sure if this is genetic or a family predisposition. Despite the uncertainty, a child has a 50% chance of having migraine if one parent suffers. The child has a 75% chance if both parents suffer.  °Q: Can children get headaches? °A: By the time they reach high school, most young people have experienced some type of headache. Many safe and effective approaches or medications can prevent a headache from occurring or stop it after it has begun.  °Q: What type of doctor should I see to diagnose and treat my headache? °A: Start with your primary caregiver. Discuss his or her experience and approach to headaches. Discuss methods of classification, diagnosis, and treatment. Your caregiver may decide to recommend you to a headache specialist, depending upon your symptoms or other  physical conditions. Having diabetes, allergies, etc., may require a more comprehensive and inclusive approach to your headache. The [employer] will provide, upon request, a list of NHF physician members in your state. °Document Released: 03/03/2004 Document Revised: 03/05/2012 Document Reviewed: 08/11/2008 °ExitCare® Patient Information ©2015 [employer]. This information is not intended to replace advice given to you by your health care provider. Make sure you discuss any questions you have with your health care provider. ° °General Headache Without Cause °A headache is pain or discomfort felt around the head or neck area. The specific cause of a headache may not be found. There are many causes and types of headaches. A few common ones are: °· Tension headaches. °· Migraine headaches. °· Cluster headaches. °· Chronic daily headaches. °HOME CARE INSTRUCTIONS  °· Keep all follow-up appointments with your caregiver or any specialist referral. °· Only take over-the-counter or prescription medicines for pain or discomfort as directed by your caregiver. °· Lie down in a dark, quiet room when you have a headache. °· Keep a headache journal to find out what may trigger your migraine headaches. For example, write down: °¨ What you eat and drink. °¨ How much sleep you get. °¨ Any change to your diet or medicines. °· Try massage or other relaxation techniques. °· Put ice packs or heat on the head and neck. Use these 3 to 4 times per day for 15 to 20 minutes each time, or as needed. °· Limit stress. °· Sit up   straight, and do not tense your muscles. °· Quit smoking if you smoke. °· Limit alcohol use. °· Decrease the amount of caffeine you drink, or stop drinking caffeine. °· Eat and sleep on a regular schedule. °· Get 7 to 9 hours of sleep, or as recommended by your caregiver. °· Keep lights dim if bright lights bother you and make your headaches worse. °SEEK MEDICAL CARE IF:  °· You have problems with the  medicines you were prescribed. °· Your medicines are not working. °· You have a change from the usual headache. °· You have nausea or vomiting. °SEEK IMMEDIATE MEDICAL CARE IF:  °· Your headache becomes severe. °· You have a fever. °· You have a stiff neck. °· You have loss of vision. °· You have muscular weakness or loss of muscle control. °· You start losing your balance or have trouble walking. °· You feel faint or pass out. °· You have severe symptoms that are different from your first symptoms. °MAKE SURE YOU:  °· Understand these instructions. °· Will watch your condition. °· Will get help right away if you are not doing well or get worse. °Document Released: 12/12/2005 Document Revised: 03/05/2012 Document Reviewed: 12/28/2011 °ExitCare® Patient Information ©2015 [employer]. This information is not intended to replace advice given to you by your health care provider. Make sure you discuss any questions you have with your health care provider. ° °

## 2015-04-28 NOTE — ED Notes (Signed)
Pt states that she has been checking her bp at work and it has been running high.  Is c/o headache that increases when standing or exerting and seeing spots at times.

## 2015-05-06 NOTE — ED Provider Notes (Signed)
CSN: [id number]     Arrival date & time 04/28/15  1408 History   First MD Initiated Contact with Patient 04/28/15 1751     Chief Complaint  Patient presents with  . Headache     (Consider location/radiation/quality/duration/timing/severity/associated sxs/prior Treatment) HPI   23yF with headache. Onset about a week ago. Denies trauma. Waxed/waned since. No appreciable exacerbating or relieving factors. Feels like pressure. Diffuse, but worse in frontal region. Doesn't feel congested. Seems worse when leaning forward though. No fever or chills. No neck pain or stiffness. No acute numbness, tingling or loss of strength.   Past Medical History  Diagnosis Date  . HSV-2 (herpes simplex virus 2) infection   . Abnormal Pap smear   . Headache(784.0)   . Chlamydia   . Hemorrhoids 04/26/2013   Past Surgical History  Procedure Laterality Date  . Dilation and curettage of uterus    . Tubal ligation Bilateral 04/15/2013    Procedure: POST PARTUM TUBAL LIGATION;  Surgeon: Catalina AntiguaPeggy Constant, MD;  Location: [facility];  Service: Gynecology;  Laterality: Bilateral;  . Cholecystectomy     Family History  Problem Relation Age of Onset  . Cancer Other     colon  . Stroke Maternal Grandfather   . Cancer Father     colon  . Stroke Mother   . Hypertension Mother    History  Substance Use Topics  . Smoking status: Current Every Day Smoker -- 0.50 packs/day    Types: Cigarettes  . Smokeless tobacco: Former NeurosurgeonUser    Quit date: 01/29/2012  . Alcohol Use: Yes     Comment: occasionally liquor   OB History    Gravida Para Term Preterm AB TAB SAB Ectopic Multiple Living   4 3 2 1 1  0 0 0 0 3     Review of Systems  All systems reviewed and negative, other than as noted in HPI.   Allergies  Review of patient's allergies indicates no known allergies.  Home Medications   Prior to Admission medications   Medication Sig Start Date End Date Taking? Authorizing Provider  ibuprofen (ADVIL,MOTRIN) 600 MG  tablet Take 600 mg by mouth every 6 (six) hours as needed for moderate pain.   Yes Historical Provider, MD  doxycycline (VIBRAMYCIN) 100 MG capsule Take 1 capsule (100 mg total) by mouth 2 (two) times daily. One po bid x 7 days Patient not taking: Reported on 04/28/2015 10/21/14   Bethann BerkshireJoseph Zammit, MD   BP 129/76 mmHg  Pulse 67  Temp(Src) 98.2 F (36.8 C) (Oral)  Resp 18  Ht 5\' 7"  (1.702 m)  Wt 207 lb (93.895 kg)  BMI 32.41 kg/m2  SpO2 99%  LMP 03/16/2015 Physical Exam  Constitutional: She is oriented to person, place, and time. She appears well-developed and well-nourished. No distress.  HENT:  Head: Normocephalic and atraumatic.  Eyes: Conjunctivae are normal. Right eye exhibits no discharge. Left eye exhibits no discharge.  Neck: Neck supple.  No nuchal rigidity  Cardiovascular: Normal rate, regular rhythm and normal heart sounds.  Exam reveals no gallop and no friction rub.   No murmur heard. Pulmonary/Chest: Effort normal and breath sounds normal. No respiratory distress.  Abdominal: Soft. She exhibits no distension. There is no tenderness.  Musculoskeletal: She exhibits no edema or tenderness.  Neurological: She is alert and oriented to person, place, and time. No cranial nerve deficit. She exhibits normal muscle tone. Coordination normal.  Good finger to nose b/l.   Skin: Skin is warm and  dry.  Psychiatric: She has a normal mood and affect. Her behavior is normal. Thought content normal.  Nursing note and vitals reviewed.   ED Course  Procedures (including critical care time) Labs Review Labs Reviewed  BASIC METABOLIC PANEL - Abnormal; Notable for the following:    Calcium 8.5 (*)    All other components within normal limits    Imaging Review No results found.   EKG Interpretation None      MDM   Final diagnoses:  Nonintractable headache, unspecified chronicity pattern, unspecified headache type    23yF with headache. Suspect primary HA. Consider emergent  secondary causes such as bleed, infectious or mass but doubt. There is no history of trauma. Pt has a nonfocal neurological exam. Afebrile and neck supple. No use of blood thinning medication. Consider ocular etiology such as acute angle closure glaucoma but doubt. Pt denies acute change in visual acuity and eye exam unremarkable. Doubt temporal arteritis given age, no temporal tenderness and temporal artery pulsations palpable. Doubt CO poisoning. No contacts with similar symptoms. Doubt venous thrombosis. Doubt carotid or vertebral arteries dissection. Symptoms improved with meds. Feel that can be safely discharged, but strict return precautions discussed. Outpt fu.     Raeford RazorStephen Calani Gick, MD 05/06/15 [phone]

## 2015-05-12 ENCOUNTER — Emergency Department (HOSPITAL_COMMUNITY)
Admission: EM | Admit: 2015-05-12 | Discharge: 2015-05-12 | Disposition: A | Discharge status: Home / Self Care | Payer: 59 | Attending: Emergency Medicine | Admitting: Emergency Medicine

## 2015-05-12 ENCOUNTER — Encounter (HOSPITAL_COMMUNITY): Payer: Self-pay | Admitting: Emergency Medicine

## 2015-05-12 DIAGNOSIS — G43009 Migraine without aura, not intractable, without status migrainosus: Secondary | ICD-10-CM

## 2015-05-12 DIAGNOSIS — R030 Elevated blood-pressure reading, without diagnosis of hypertension: Secondary | ICD-10-CM | POA: Insufficient documentation

## 2015-05-12 DIAGNOSIS — R630 Anorexia: Secondary | ICD-10-CM | POA: Insufficient documentation

## 2015-05-12 DIAGNOSIS — IMO0002 Reserved for concepts with insufficient information to code with codable children: Secondary | ICD-10-CM

## 2015-05-12 DIAGNOSIS — G43909 Migraine, unspecified, not intractable, without status migrainosus: Secondary | ICD-10-CM | POA: Insufficient documentation

## 2015-05-12 DIAGNOSIS — Z3202 Encounter for pregnancy test, result negative: Secondary | ICD-10-CM | POA: Diagnosis not present

## 2015-05-12 DIAGNOSIS — R42 Dizziness and giddiness: Secondary | ICD-10-CM | POA: Diagnosis present

## 2015-05-12 DIAGNOSIS — Z720 Tobacco use: Secondary | ICD-10-CM | POA: Diagnosis not present

## 2015-05-12 DIAGNOSIS — Z8619 Personal history of other infectious and parasitic diseases: Secondary | ICD-10-CM | POA: Diagnosis not present

## 2015-05-12 DIAGNOSIS — Z79899 Other long term (current) drug therapy: Secondary | ICD-10-CM | POA: Diagnosis not present

## 2015-05-12 LAB — CBC WITH DIFFERENTIAL/PLATELET
BASOS ABS: 0 10*3/uL (ref 0.0–0.1)
BASOS PCT: 1 % (ref 0–1)
EOS ABS: 0.5 10*3/uL (ref 0.0–0.7)
EOS PCT: 8 % — AB (ref 0–5)
HEMATOCRIT: 37.2 % (ref 36.0–46.0)
Hemoglobin: 12.2 g/dL (ref 12.0–15.0)
Lymphocytes Relative: 47 % — ABNORMAL HIGH (ref 12–46)
Lymphs Abs: 2.9 10*3/uL (ref 0.7–4.0)
MCH: 28.8 pg (ref 26.0–34.0)
MCHC: 32.8 g/dL (ref 30.0–36.0)
MCV: 87.9 fL (ref 78.0–100.0)
MONO ABS: 0.2 10*3/uL (ref 0.1–1.0)
Monocytes Relative: 4 % (ref 3–12)
NEUTROS ABS: 2.4 10*3/uL (ref 1.7–7.7)
Neutrophils Relative %: 40 % — ABNORMAL LOW (ref 43–77)
Platelets: 312 10*3/uL (ref 150–400)
RBC: 4.23 MIL/uL (ref 3.87–5.11)
RDW: 14.7 % (ref 11.5–15.5)
WBC: 6 10*3/uL (ref 4.0–10.5)

## 2015-05-12 LAB — BASIC METABOLIC PANEL
ANION GAP: 7 (ref 5–15)
BUN: 10 mg/dL (ref 6–20)
CHLORIDE: 106 mmol/L (ref 101–111)
CO2: 27 mmol/L (ref 22–32)
Calcium: 9 mg/dL (ref 8.9–10.3)
Creatinine, Ser: 0.61 mg/dL (ref 0.44–1.00)
GFR calc Af Amer: >60 mL/min (ref >60)
GFR calc non Af Amer: >60 mL/min (ref >60)
GLUCOSE: 88 mg/dL (ref 65–99)
Potassium: 3.7 mmol/L (ref 3.5–5.1)
SODIUM: 140 mmol/L (ref 135–145)

## 2015-05-12 LAB — POC URINE PREG, ED: Preg Test, Ur: NEGATIVE

## 2015-05-12 MED ORDER — METOCLOPRAMIDE HCL 5 MG/ML IJ SOLN
10.0000 mg | Freq: Once | INTRAMUSCULAR | Status: AC
  Administered 2015-05-12: 10 mg via INTRAMUSCULAR
  Filled 2015-05-12: qty 2

## 2015-05-12 MED ORDER — KETOROLAC TROMETHAMINE 60 MG/2ML IM SOLN
60.0000 mg | Freq: Once | INTRAMUSCULAR | Status: AC
  Administered 2015-05-12: 60 mg via INTRAMUSCULAR
  Filled 2015-05-12: qty 2

## 2015-05-12 MED ORDER — METOCLOPRAMIDE HCL 10 MG PO TABS: 10.0000 mg | ORAL_TABLET | Freq: Four times a day (QID) | ORAL | Status: DC | PRN

## 2015-05-12 MED ORDER — HYDROCHLOROTHIAZIDE 12.5 MG PO CAPS: 12.5000 mg | ORAL_CAPSULE | Freq: Every day | ORAL | Status: AC

## 2015-05-12 MED ORDER — ACETAMINOPHEN 325 MG PO TABS
650.0000 mg | ORAL_TABLET | Freq: Once | ORAL | Status: AC
  Administered 2015-05-12: 650 mg via ORAL
  Filled 2015-05-12: qty 2

## 2015-05-12 NOTE — ED Notes (Signed)
PT stated she was sitting down at work and became dizzy with headache andnausea but denies any vomiting or diarrhea. PT states recent visit to ED and was told she had HTN but unable to make appointment for follow up with primary.

## 2015-05-12 NOTE — ED Provider Notes (Signed)
CSN: [id number]     Arrival date & time 05/12/15  1224 History   First MD Initiated Contact with Patient 05/12/15 1235     Chief Complaint  Patient presents with  . Dizziness     (Consider location/radiation/quality/duration/timing/severity/associated sxs/prior Treatment) HPI Comments:  [age] year old female with history of migraines presents with intermittent gradual onset headache for the past year. Clarified nursing note, headaches are gradual onset, similar to previous. Patient has been unable to get into a primary care doctor despite multiple attempts and she is on the wait list. Headaches in the past improved with headache cocktail. Patient has these headaches fairly regularly and have been worse since she regained the weight that she had lost in the past.Patient does not tolerate Benadryl well. No neck stiffness or fevers. Patient is not currently on blood pressure medication however is a family history of high blood pressure and recently every time it is checked it has been elevated between 90 and 100 diastolic. Patient does have mild photophobia nausea. Patient has dizziness/lightheadedness intermittent with headache. Patient feels her headaches are worse when her blood pressure is elevated.  The history is provided by the patient.    Past Medical History  Diagnosis Date  . HSV-2 (herpes simplex virus 2) infection   . Abnormal Pap smear   . Headache(784.0)   . Chlamydia   . Hemorrhoids 04/26/2013   Past Surgical History  Procedure Laterality Date  . Dilation and curettage of uterus    . Tubal ligation Bilateral 04/15/2013    Procedure: POST PARTUM TUBAL LIGATION;  Surgeon: Catalina AntiguaPeggy Constant, MD;  Location: [facility];  Service: Gynecology;  Laterality: Bilateral;  . Cholecystectomy     Family History  Problem Relation Age of Onset  . Cancer Other     colon  . Stroke Maternal Grandfather   . Cancer Father     colon  . Stroke Mother   . Hypertension Mother    History  Substance Use  Topics  . Smoking status: Current Every Day Smoker -- 0.50 packs/day    Types: Cigarettes  . Smokeless tobacco: Former NeurosurgeonUser    Quit date: 01/29/2012  . Alcohol Use: Yes     Comment: occasionally liquor   OB History    Gravida Para Term Preterm AB TAB SAB Ectopic Multiple Living   4 3 2 1 1  0 0 0 0 3     Review of Systems  Constitutional: Positive for appetite change. Negative for fever and chills.  HENT: Negative for congestion.   Eyes: Positive for photophobia. Negative for visual disturbance.  Respiratory: Negative for shortness of breath.   Cardiovascular: Negative for chest pain.  Gastrointestinal: Negative for vomiting and abdominal pain.  Genitourinary: Negative for dysuria and flank pain.  Musculoskeletal: Negative for back pain, neck pain and neck stiffness.  Skin: Negative for rash.  Neurological: Positive for dizziness, light-headedness and headaches. Negative for syncope and weakness.      Allergies  Review of patient's allergies indicates no known allergies.  Home Medications   Prior to Admission medications   Medication Sig Start Date End Date Taking? Authorizing Provider  doxycycline (VIBRAMYCIN) 100 MG capsule Take 1 capsule (100 mg total) by mouth 2 (two) times daily. One po bid x 7 days Patient not taking: Reported on 04/28/2015 10/21/14   Bethann BerkshireJoseph Zammit, MD  hydrochlorothiazide (MICROZIDE) 12.5 MG capsule Take 1 capsule (12.5 mg total) by mouth daily. 05/12/15   Blane OharaJoshua Kristeen Lantz, MD  ibuprofen (ADVIL,MOTRIN) 600 MG tablet Take  600 mg by mouth every 6 (six) hours as needed for moderate pain.    Historical Provider, MD  metoCLOPramide (REGLAN) 10 MG tablet Take 1 tablet (10 mg total) by mouth every 6 (six) hours as needed for nausea (nausea/headache). 05/12/15   Blane OharaJoshua Seve Monette, MD   BP 138/90 mmHg  Pulse 60  Temp(Src) 98.1 F (36.7 C) (Oral)  Resp 18  Ht 5\' 7"  (1.702 m)  Wt 228 lb (103.42 kg)  BMI 35.70 kg/m2  SpO2 100%  LMP 05/03/2015 Physical Exam   Constitutional: She is oriented to person, place, and time. She appears well-developed and well-nourished.  HENT:  Head: Normocephalic and atraumatic.  Eyes: Conjunctivae are normal. Right eye exhibits no discharge. Left eye exhibits no discharge.  Neck: Normal range of motion. Neck supple. No tracheal deviation present.  Cardiovascular: Normal rate and regular rhythm.   Pulmonary/Chest: Effort normal and breath sounds normal.  Abdominal: Soft. She exhibits no distension. There is no tenderness. There is no guarding.  Musculoskeletal: She exhibits no edema.  Neurological: She is alert and oriented to person, place, and time.  Neck supple no meningismus 5+ strength in UE and LE with f/e at major joints. Sensation to palpation intact in UE and LE. CNs 2-12 grossly intact.  EOMFI.  PERRL.   Finger nose and coordination intact bilateral.   Visual fields intact to finger testing.   Skin: Skin is warm. No rash noted.  Psychiatric: She has a normal mood and affect.  Nursing note and vitals reviewed.   ED Course  Procedures (including critical care time) Labs Review Labs Reviewed  CBC WITH DIFFERENTIAL/PLATELET - Abnormal; Notable for the following:    Neutrophils Relative % 40 (*)    Lymphocytes Relative 47 (*)    Eosinophils Relative 8 (*)    All other components within normal limits  BASIC METABOLIC PANEL  POC URINE PREG, ED    Imaging Review No results found.   EKG Interpretation None      MDM   Final diagnoses:  Abnormal blood pressure  Nonintractable migraine, unspecified migraine type   Patient presents with recurrent headache similar previous. Photophobia gradual onset and nausea as well as mild squeaky lines prior to headache concern for migraine. Headache cocktail ordered. Plan for continued close outpatient follow-up with a primary care doctor. Discussed starting patient on low-dose blood pressure medication, also discussed improved eating habit and exercise. Pt  improved in ED.   Results and differential diagnosis were discussed with the patient/parent/guardian. Close follow up outpatient was discussed, comfortable with the plan.   Medications  metoCLOPramide (REGLAN) injection 10 mg (10 mg Intramuscular Given 05/12/15 1256)  acetaminophen (TYLENOL) tablet 650 mg (650 mg Oral Given 05/12/15 1255)  ketorolac (TORADOL) injection 60 mg (60 mg Intramuscular Given 05/12/15 1405)    Filed Vitals:   05/12/15 1230  BP: 138/90  Pulse: 60  Temp: 98.1 F (36.7 C)  TempSrc: Oral  Resp: 18  Height: 5\' 7"  (1.702 m)  Weight: 228 lb (103.42 kg)  SpO2: 100%    Final diagnoses:  Abnormal blood pressure  Nonintractable migraine, unspecified migraine type       .   Blane OharaJoshua Erandy Mceachern, MD 05/12/15 1409

## 2015-05-12 NOTE — ED Notes (Signed)
Patient has had episodes of sudden onset HA/dizziness/nausea 2 x/wk for about a year. The HA lasts 2-3 days w/positional vertigo.  She reports a frontal HA w/"squiggly" lines and black spots" in visual field.  Episodes are also positive for photophobia, tinnitus w/hearing loss alternating ears, vertigo and disequilibrium.  Nothing helps the HA except lying in dark room.

## 2015-05-12 NOTE — ED Notes (Signed)
Patient has drank water and is now taking gingerale. No nausea or vomiting with this.

## 2015-05-12 NOTE — Discharge Instructions (Signed)
If you were given medicines take as directed.  If you are on coumadin or contraceptives realize their levels and effectiveness is altered by many different medicines.  If you have any reaction (rash, tongues swelling, other) to the medicines stop taking and see a physician.   Please follow up as directed and return to the ER or see a physician for new or worsening symptoms.  Thank you. Filed Vitals:   05/12/15 1230  BP: 138/90  Pulse: 60  Temp: 98.1 F (36.7 C)  TempSrc: Oral  Resp: 18  Height: 5\' 7"  (1.702 m)  Weight: 228 lb (103.42 kg)  SpO2: 100%

## 2015-05-13 ENCOUNTER — Other Ambulatory Visit: Payer: Medicaid Other | Admitting: Obstetrics & Gynecology

## 2015-05-13 ENCOUNTER — Encounter: Payer: Self-pay | Admitting: Obstetrics & Gynecology

## 2015-07-13 ENCOUNTER — Emergency Department (HOSPITAL_COMMUNITY)
Admission: EM | Admit: 2015-07-13 | Discharge: 2015-07-13 | Discharge status: Against Medical Advice | Payer: 59 | Attending: Emergency Medicine | Admitting: Emergency Medicine

## 2015-07-13 ENCOUNTER — Encounter (HOSPITAL_COMMUNITY): Payer: Self-pay | Admitting: *Deleted

## 2015-07-13 DIAGNOSIS — R197 Diarrhea, unspecified: Secondary | ICD-10-CM | POA: Diagnosis not present

## 2015-07-13 DIAGNOSIS — Z720 Tobacco use: Secondary | ICD-10-CM | POA: Insufficient documentation

## 2015-07-13 DIAGNOSIS — R51 Headache: Secondary | ICD-10-CM | POA: Insufficient documentation

## 2015-07-13 DIAGNOSIS — R109 Unspecified abdominal pain: Secondary | ICD-10-CM | POA: Insufficient documentation

## 2015-07-13 NOTE — ED Notes (Addendum)
Patient c/o headaches and diarrhea since yesterday. Also c/o abd pain.

## 2015-09-14 ENCOUNTER — Emergency Department (HOSPITAL_COMMUNITY): Payer: 59

## 2015-09-14 ENCOUNTER — Emergency Department (HOSPITAL_COMMUNITY)
Admission: EM | Admit: 2015-09-14 | Discharge: 2015-09-14 | Disposition: A | Discharge status: Home / Self Care | Payer: 59 | Attending: Emergency Medicine | Admitting: Emergency Medicine

## 2015-09-14 ENCOUNTER — Encounter (HOSPITAL_COMMUNITY): Payer: Self-pay

## 2015-09-14 DIAGNOSIS — Z8619 Personal history of other infectious and parasitic diseases: Secondary | ICD-10-CM | POA: Diagnosis not present

## 2015-09-14 DIAGNOSIS — J069 Acute upper respiratory infection, unspecified: Secondary | ICD-10-CM | POA: Insufficient documentation

## 2015-09-14 DIAGNOSIS — Z720 Tobacco use: Secondary | ICD-10-CM | POA: Diagnosis not present

## 2015-09-14 DIAGNOSIS — Z79899 Other long term (current) drug therapy: Secondary | ICD-10-CM | POA: Diagnosis not present

## 2015-09-14 DIAGNOSIS — Z8719 Personal history of other diseases of the digestive system: Secondary | ICD-10-CM | POA: Diagnosis not present

## 2015-09-14 DIAGNOSIS — R05 Cough: Secondary | ICD-10-CM | POA: Diagnosis present

## 2015-09-14 MED ORDER — IBUPROFEN 600 MG PO TABS: 600.0000 mg | ORAL_TABLET | Freq: Four times a day (QID) | ORAL | Status: DC | PRN

## 2015-09-14 MED ORDER — PSEUDOEPHEDRINE HCL ER 120 MG PO TB12: 120.0000 mg | ORAL_TABLET | Freq: Two times a day (BID) | ORAL | Status: DC

## 2015-09-14 MED ORDER — BENZONATATE 100 MG PO CAPS: 100.0000 mg | ORAL_CAPSULE | Freq: Three times a day (TID) | ORAL | Status: DC

## 2015-09-14 NOTE — Discharge Instructions (Signed)
Cough, Adult ° A cough is a reflex. It helps you clear your throat and airways. A cough can help heal your body. A cough can last 2 or 3 weeks (acute) or may last more than 8 weeks (chronic). Some common causes of a cough can include an infection, allergy, or a cold. °HOME CARE °· Only take medicine as told by your doctor. °· If given, take your medicines (antibiotics) as told. Finish them even if you start to feel better. °· Use a cold steam vaporizer or humidifier in your home. This can help loosen thick spit (secretions). °· Sleep so you are almost sitting up (semi-upright). Use pillows to do this. This helps reduce coughing. °· Rest as needed. °· Stop smoking if you smoke. °GET HELP RIGHT AWAY IF: °· You have yellowish-white fluid (pus) in your thick spit. °· Your cough gets worse. °· Your medicine does not reduce coughing, and you are losing sleep. °· You cough up blood. °· You have trouble breathing. °· Your pain gets worse and medicine does not help. °· You have a fever. °MAKE SURE YOU:  °· Understand these instructions. °· Will watch your condition. °· Will get help right away if you are not doing well or get worse. °Document Released: 08/25/2011 Document Revised: 04/28/2014 Document Reviewed: 08/25/2011 °ExitCare® Patient Information ©2015 [employer]. This information is not intended to replace advice given to you by your health care provider. Make sure you discuss any questions you have with your health care provider. ° °Viral Infections °A viral infection can be caused by different types of viruses. Most viral infections are not serious and resolve on their own. However, some infections may cause severe symptoms and may lead to further complications. °SYMPTOMS °Viruses can frequently cause: °· Minor sore throat. °· Aches and pains. °· Headaches. °· Runny nose. °· Different types of rashes. °· Watery eyes. °· Tiredness. °· Cough. °· Loss of appetite. °· Gastrointestinal infections, resulting in nausea,  vomiting, and diarrhea. °These symptoms do not respond to antibiotics because the infection is not caused by bacteria. However, you might catch a bacterial infection following the viral infection. This is sometimes called a "superinfection." Symptoms of such a bacterial infection may include: °· Worsening sore throat with pus and difficulty swallowing. °· Swollen neck glands. °· Chills and a high or persistent fever. °· Severe headache. °· Tenderness over the sinuses. °· Persistent overall ill feeling (malaise), muscle aches, and tiredness (fatigue). °· Persistent cough. °· Yellow, green, or brown mucus production with coughing. °HOME CARE INSTRUCTIONS  °· Only take over-the-counter or prescription medicines for pain, discomfort, diarrhea, or fever as directed by your caregiver. °· Drink enough water and fluids to keep your urine clear or pale yellow. Sports drinks can provide valuable electrolytes, sugars, and hydration. °· Get plenty of rest and maintain proper nutrition. Soups and broths with crackers or rice are fine. °SEEK IMMEDIATE MEDICAL CARE IF:  °· You have severe headaches, shortness of breath, chest pain, neck pain, or an unusual rash. °· You have uncontrolled vomiting, diarrhea, or you are unable to keep down fluids. °· You or your child has an oral temperature above 102° F (38.9° C), not controlled by medicine. °· Your baby is older than 3 months with a rectal temperature of 102° F (38.9° C) or higher. °· Your baby is 3 months old or younger with a rectal temperature of 100.4° F (38° C) or higher. °MAKE SURE YOU:  °· Understand these instructions. °· Will watch your condition. °·   Will get help right away if you are not doing well or get worse. °Document Released: 09/21/2005 Document Revised: 03/05/2012 Document Reviewed: 04/18/2011 °ExitCare® Patient Information ©2015 [employer]. This information is not intended to replace advice given to you by your health care provider. Make sure you discuss any  questions you have with your health care provider. ° °

## 2015-09-14 NOTE — ED Provider Notes (Signed)
CSN: [id number]     Arrival date & time 09/14/15  4098 History  This chart was scribed for Linwood Dibbles, MD by Tanda Rockers, ED Scribe. This patient was seen in room APA09/APA09 and the patient's care was started at 10:13 AM.  Chief Complaint  Patient presents with  . Cough   The history is provided by the patient. No language interpreter was used.     HPI Comments: Melinda Valenzuela is a 24 y.o. female who presents to the Emergency Department complaining of sudden onset cough, nasal congestion, sinus pressure,  generalized weakness, and myalgias x 3 days. Pt notes chest wall pain from the coughing that was mildly relieved after drinking coffee this morning. She mentions that her child is having similar symptoms as well. Pt states that she felt fine the night before but woke up with all of the symptoms. Denies fever, chills, or any other associated symptoms.   Past Medical History  Diagnosis Date  . HSV-2 (herpes simplex virus 2) infection   . Abnormal Pap smear   . Headache(784.0)   . Chlamydia   . Hemorrhoids 04/26/2013   Past Surgical History  Procedure Laterality Date  . Dilation and curettage of uterus    . Tubal ligation Bilateral 04/15/2013    Procedure: POST PARTUM TUBAL LIGATION;  Surgeon: Catalina Antigua, MD;  Location: [facility];  Service: Gynecology;  Laterality: Bilateral;  . Cholecystectomy     Family History  Problem Relation Age of Onset  . Cancer Other     colon  . Stroke Maternal Grandfather   . Cancer Father     colon  . Stroke Mother   . Hypertension Mother    Social History  Substance Use Topics  . Smoking status: Current Every Day Smoker -- 0.50 packs/day    Types: Cigarettes  . Smokeless tobacco: Former Neurosurgeon    Quit date: 01/29/2012  . Alcohol Use: Yes     Comment: occasionally liquor   OB History    Gravida Para Term Preterm AB TAB SAB Ectopic Multiple Living   0 0 0 0 3     Review of Systems  Constitutional: Negative for fever and  chills.  HENT: Positive for congestion and sinus pressure.   Respiratory: Positive for cough.   Musculoskeletal: Positive for myalgias.  Neurological: Positive for weakness (Generalized).  All other systems reviewed and are negative.  Allergies  Review of patient's allergies indicates no known allergies.  Home Medications   Prior to Admission medications   Medication Sig Start Date End Date Taking? Authorizing Provider  diphenhydrAMINE (BENADRYL) 25 MG tablet Take 25 mg by mouth every 6 (six) hours as needed.   Yes Historical Provider, MD  hydrochlorothiazide (MICROZIDE) 12.5 MG capsule Take 1 capsule (12.5 mg total) by mouth daily. 05/12/15  Yes Blane Ohara, MD  Multiple Vitamin (MULTIVITAMIN WITH MINERALS) TABS tablet Take 1 tablet by mouth daily.   Yes Historical Provider, MD  benzonatate (TESSALON) 100 MG capsule Take 1 capsule (100 mg total) by mouth every 8 (eight) hours. 09/14/15   Linwood Dibbles, MD  ibuprofen (ADVIL,MOTRIN) 600 MG tablet Take 1 tablet (600 mg total) by mouth every 6 (six) hours as needed for moderate pain. 09/14/15   Linwood Dibbles, MD  pseudoephedrine (SUDAFED 12 HOUR) 120 MG 12 hr tablet Take 1 tablet (120 mg total) by mouth every 12 (twelve) hours. 09/14/15   Linwood Dibbles, MD   Triage Vitals: BP 122/94 mmHg  Pulse  88  Temp(Src) 98.9 F (37.2 C) (Oral)  Resp 20  Ht  (1.702 m)  Wt 240 lb (108.863 kg)  BMI 37.58 kg/m2  SpO2 98%  LMP 08/19/2015   Physical Exam  Constitutional: She appears well-developed and well-nourished. No distress.  HENT:  Head: Normocephalic and atraumatic.  Right Ear: Tympanic membrane and external ear normal.  Left Ear: Tympanic membrane and external ear normal.  Nose: Rhinorrhea present. Right sinus exhibits no maxillary sinus tenderness. Left sinus exhibits no maxillary sinus tenderness.  Mouth/Throat: No oropharyngeal exudate.  Nasal congestion  Eyes: Conjunctivae are normal. Right eye exhibits no discharge. Left eye exhibits no  discharge. No scleral icterus.  Neck: Neck supple. No tracheal deviation present.  Cardiovascular: Normal rate, regular rhythm and intact distal pulses.   Pulmonary/Chest: Effort normal and breath sounds normal. No stridor. No respiratory distress. She has no wheezes. She has no rales.  Abdominal: Soft. Bowel sounds are normal. She exhibits no distension. There is no tenderness. There is no rebound and no guarding.  Musculoskeletal: She exhibits no edema or tenderness.  Neurological: She is alert. She has normal strength. No cranial nerve deficit (no facial droop, extraocular movements intact, no slurred speech) or sensory deficit. She exhibits normal muscle tone. She displays no seizure activity. Coordination normal.  Skin: Skin is warm and dry. No rash noted.  Psychiatric: She has a normal mood and affect.  Nursing note and vitals reviewed.   ED Course  Procedures (including critical care time)  DIAGNOSTIC STUDIES: Oxygen Saturation is 98% on RA, normal by my interpretation.    COORDINATION OF CARE: 10:17 AM-Discussed treatment plan which includes CXR with pt at bedside and pt agreed to plan.    Imaging Review Dg Chest 2 View  09/14/2015   CLINICAL DATA:  Cough, congestion, sore throat and generalized weakness for 2 days. Smoker.  EXAM: CHEST  2 VIEW  COMPARISON:  12/30/2011  FINDINGS: The heart size and mediastinal contours are within normal limits. Both lungs are clear. The visualized skeletal structures are unremarkable.  IMPRESSION: No active cardiopulmonary disease.   Electronically Signed   By: Charlett Nose M.D.   On: 09/14/2015 10:46   I have personally reviewed and evaluated these image results as part of my medical decision-making.    MDM   Final diagnoses:  URI, acute    Symptoms are consistent with a simple upper respiratory infection. There is no evidence to suggest pneumonia on my exam. The patient does not appear to have an otitis media. Sinus congestion but doubt  bacterial sinusitis. I discussed supportive treatment. I encouraged followup with the primary care doctor next week if symptoms have not resolved. Warning signs and reasons to return to the emergency room were discussed   I personally performed the services described in this documentation, which was scribed in my presence.  The recorded information has been reviewed and is accurate.      Linwood Dibbles, MD 09/14/15 1058

## 2015-09-14 NOTE — ED Notes (Signed)
MD at bedside. 

## 2015-09-14 NOTE — ED Notes (Signed)
Pt c/o cough, congestion, generalized weakness, and sore throat.

## 2015-09-14 NOTE — ED Notes (Signed)
C/O sore throat/congestion/body aches x3 days. Reports of productive cough. Denies fever/chills.

## 2015-10-22 ENCOUNTER — Encounter (HOSPITAL_COMMUNITY): Payer: Self-pay

## 2015-10-22 ENCOUNTER — Emergency Department (HOSPITAL_COMMUNITY)
Admission: EM | Admit: 2015-10-22 | Discharge: 2015-10-22 | Disposition: A | Discharge status: Home / Self Care | Payer: 59 | Attending: Emergency Medicine | Admitting: Emergency Medicine

## 2015-10-22 DIAGNOSIS — R300 Dysuria: Secondary | ICD-10-CM | POA: Diagnosis present

## 2015-10-22 DIAGNOSIS — Z79899 Other long term (current) drug therapy: Secondary | ICD-10-CM | POA: Insufficient documentation

## 2015-10-22 DIAGNOSIS — Z3202 Encounter for pregnancy test, result negative: Secondary | ICD-10-CM | POA: Insufficient documentation

## 2015-10-22 DIAGNOSIS — Z8619 Personal history of other infectious and parasitic diseases: Secondary | ICD-10-CM | POA: Diagnosis not present

## 2015-10-22 DIAGNOSIS — N390 Urinary tract infection, site not specified: Secondary | ICD-10-CM

## 2015-10-22 DIAGNOSIS — Z8719 Personal history of other diseases of the digestive system: Secondary | ICD-10-CM | POA: Insufficient documentation

## 2015-10-22 DIAGNOSIS — Z720 Tobacco use: Secondary | ICD-10-CM | POA: Diagnosis not present

## 2015-10-22 DIAGNOSIS — R319 Hematuria, unspecified: Secondary | ICD-10-CM

## 2015-10-22 LAB — URINALYSIS, ROUTINE W REFLEX MICROSCOPIC
BILIRUBIN URINE: NEGATIVE
GLUCOSE, UA: NEGATIVE mg/dL
Ketones, ur: NEGATIVE mg/dL
Leukocytes, UA: NEGATIVE
Nitrite: NEGATIVE
PROTEIN: 100 mg/dL — AB
Specific Gravity, Urine: 1.025 (ref 1.005–1.030)
UROBILINOGEN UA: 0.2 mg/dL (ref 0.0–1.0)
pH: 7 (ref 5.0–8.0)

## 2015-10-22 LAB — URINE MICROSCOPIC-ADD ON

## 2015-10-22 LAB — PREGNANCY, URINE: PREG TEST UR: NEGATIVE

## 2015-10-22 MED ORDER — CEPHALEXIN 500 MG PO CAPS
500.0000 mg | ORAL_CAPSULE | Freq: Once | ORAL | Status: AC
  Administered 2015-10-22: 500 mg via ORAL
  Filled 2015-10-22: qty 1

## 2015-10-22 MED ORDER — CEPHALEXIN 500 MG PO CAPS: 500.0000 mg | ORAL_CAPSULE | Freq: Three times a day (TID) | ORAL | Status: DC

## 2015-10-22 MED ORDER — PHENAZOPYRIDINE HCL 100 MG PO TABS
200.0000 mg | ORAL_TABLET | Freq: Once | ORAL | Status: AC
  Administered 2015-10-22: 200 mg via ORAL
  Filled 2015-10-22: qty 2

## 2015-10-22 MED ORDER — PHENAZOPYRIDINE HCL 200 MG PO TABS
200.0000 mg | ORAL_TABLET | Freq: Three times a day (TID) | ORAL | Status: DC | PRN
Start: 2015-10-22 — End: 2016-05-30

## 2015-10-22 NOTE — ED Notes (Signed)
Frequency and pain with urination x 3 days.

## 2015-10-22 NOTE — ED Notes (Signed)
Pt states understanding of care given and follow up instructions 

## 2015-10-22 NOTE — Discharge Instructions (Signed)

## 2015-10-22 NOTE — ED Provider Notes (Signed)
CSN: [id number]     Arrival date & time 10/22/15  7829 History   First MD Initiated Contact with Patient 10/22/15 0326     Chief Complaint  Patient presents with  . Dysuria      HPI Patient presents the emergency department complaining of mild dysuria and urinary frequency over the past 2-3 days.  No fevers or chills.  Denies nausea vomiting.  Did have some mild left flank pain which is improved at this time.  No history kidney stones.  Prior cholecystectomy.  She reports no vaginal symptoms.  She reports she is currently not sexually active.  Symptoms are mild in severity   Past Medical History  Diagnosis Date  . HSV-2 (herpes simplex virus 2) infection   . Abnormal Pap smear   . Headache(784.0)   . Chlamydia   . Hemorrhoids 04/26/2013   Past Surgical History  Procedure Laterality Date  . Dilation and curettage of uterus    . Tubal ligation Bilateral 04/15/2013    Procedure: POST PARTUM TUBAL LIGATION;  Surgeon: Catalina Antigua, MD;  Location: [facility];  Service: Gynecology;  Laterality: Bilateral;  . Cholecystectomy     Family History  Problem Relation Age of Onset  . Cancer Other     colon  . Stroke Maternal Grandfather   . Cancer Father     colon  . Stroke Mother   . Hypertension Mother    Social History  Substance Use Topics  . Smoking status: Current Every Day Smoker -- 0.50 packs/day    Types: Cigarettes  . Smokeless tobacco: Former Neurosurgeon    Quit date: 01/29/2012  . Alcohol Use: Yes     Comment: occasionally liquor   OB History    Gravida Para Term Preterm AB TAB SAB Ectopic Multiple Living   0 0 0 0 3     Review of Systems  All other systems reviewed and are negative.     Allergies  Review of patient's allergies indicates no known allergies.  Home Medications   Prior to Admission medications   Medication Sig Start Date End Date Taking? Authorizing Provider  Multiple Vitamin (MULTIVITAMIN WITH MINERALS) TABS tablet Take 1 tablet by mouth  daily.   Yes Historical Provider, MD         hydrochlorothiazide (MICROZIDE) 12.5 MG capsule Take 1 capsule (12.5 mg total) by mouth daily. 05/12/15   Blane Ohara, MD                 BP 130/87 mmHg  Pulse 88  Temp(Src) 97.9 F (36.6 C) (Oral)  Resp 18  Ht  (1.702 m)  Wt 242 lb (109.77 kg)  BMI 37.89 kg/m2  SpO2 99%  LMP 09/25/2015 Physical Exam  Constitutional: She is oriented to person, place, and time. She appears well-developed and well-nourished.  HENT:  Head: Normocephalic.  Eyes: EOM are normal.  Neck: Normal range of motion.  Cardiovascular: Normal rate.   Pulmonary/Chest: Effort normal.  Abdominal: She exhibits no distension. There is no tenderness.  Genitourinary:  No CVA tenderness  Musculoskeletal: Normal range of motion.  Neurological: She is alert and oriented to person, place, and time.  Psychiatric: She has a normal mood and affect.  Nursing note and vitals reviewed.   ED Course  Procedures (including critical care time) Labs Review Labs Reviewed  URINALYSIS, ROUTINE W REFLEX MICROSCOPIC (NOT AT [facility]) - Abnormal; Notable for the following:    APPearance HAZY (*)  Hgb urine dipstick LARGE (*)    Protein, ur 100 (*)    All other components within normal limits  URINE MICROSCOPIC-ADD ON - Abnormal; Notable for the following:    Squamous Epithelial / LPF MANY (*)    Bacteria, UA MANY (*)    All other components within normal limits  URINE CULTURE  PREGNANCY, URINE    Imaging Review No results found. I have personally reviewed and evaluated these images and lab results as part of my medical decision-making.   EKG Interpretation None      MDM   Final diagnoses:  Urinary tract infection with hematuria, site unspecified    Urine culture sent.  Patient be started on Keflex for urinary tract infection.  Comfortable this time.  Doubt ureteral stone.  Blood and urine likely secondary to hemorrhagic cystitis.  Patient understands return to  the ER for new or worsening symptoms    Azalia BilisKevin Yatzil Clippinger, MD 10/22/15 0430

## 2015-10-24 LAB — URINE CULTURE: Culture: >=100000

## 2015-10-25 ENCOUNTER — Telehealth (HOSPITAL_BASED_OUTPATIENT_CLINIC_OR_DEPARTMENT_OTHER): Payer: Self-pay | Admitting: Emergency Medicine

## 2015-10-25 NOTE — Telephone Encounter (Signed)
Post ED Visit - Positive Culture Follow-up  Culture report reviewed by antimicrobial stewardship pharmacist:  []  Celedonio MiyamotoJeremy Frens, Pharm.D., BCPS []  Georgina PillionElizabeth Martin, Pharm.D., BCPS []  RumseyMinh Pham, [address]Ph[address]D., BCPS, AAHIVP []  Estella HuskMichelle Turner, Pharm.D., BCPS, AAHIVP []  Cristy Reyes, [address] []  Tennis Mustassie Stewart, Pharm.D. Garvin FilaMike Maccia PharmD  Positive urine culture Diptheroids, Staph Treated with cephalexin, organism sensitive to the same and no further patient follow-up is required at this time.  Berle MullMiller, Amyri Frenz 10/25/2015, 9:18 AM

## 2016-05-30 ENCOUNTER — Encounter (HOSPITAL_COMMUNITY): Payer: Self-pay | Admitting: *Deleted

## 2016-05-30 ENCOUNTER — Emergency Department (HOSPITAL_COMMUNITY)
Admission: EM | Admit: 2016-05-30 | Discharge: 2016-05-30 | Disposition: A | Discharge status: Home / Self Care | Payer: 59 | Attending: Emergency Medicine | Admitting: Emergency Medicine

## 2016-05-30 DIAGNOSIS — R112 Nausea with vomiting, unspecified: Secondary | ICD-10-CM | POA: Insufficient documentation

## 2016-05-30 DIAGNOSIS — R197 Diarrhea, unspecified: Secondary | ICD-10-CM | POA: Insufficient documentation

## 2016-05-30 DIAGNOSIS — F17210 Nicotine dependence, cigarettes, uncomplicated: Secondary | ICD-10-CM | POA: Insufficient documentation

## 2016-05-30 DIAGNOSIS — R1110 Vomiting, unspecified: Secondary | ICD-10-CM

## 2016-05-30 DIAGNOSIS — L723 Sebaceous cyst: Secondary | ICD-10-CM | POA: Insufficient documentation

## 2016-05-30 DIAGNOSIS — R51 Headache: Secondary | ICD-10-CM | POA: Insufficient documentation

## 2016-05-30 LAB — URINALYSIS, ROUTINE W REFLEX MICROSCOPIC
Bilirubin Urine: NEGATIVE
Glucose, UA: NEGATIVE mg/dL
HGB URINE DIPSTICK: NEGATIVE
Ketones, ur: NEGATIVE mg/dL
LEUKOCYTES UA: NEGATIVE
Nitrite: NEGATIVE
Protein, ur: NEGATIVE mg/dL
Specific Gravity, Urine: 1.025 (ref 1.005–1.030)
pH: 6 (ref 5.0–8.0)

## 2016-05-30 LAB — POC URINE PREG, ED: PREG TEST UR: NEGATIVE

## 2016-05-30 MED ORDER — ONDANSETRON 8 MG PO TBDP
8.0000 mg | ORAL_TABLET | Freq: Once | ORAL | Status: AC
  Administered 2016-05-30: 8 mg via ORAL
  Filled 2016-05-30: qty 1

## 2016-05-30 MED ORDER — IBUPROFEN 800 MG PO TABS
800.0000 mg | ORAL_TABLET | Freq: Once | ORAL | Status: AC
  Administered 2016-05-30: 800 mg via ORAL
  Filled 2016-05-30: qty 1

## 2016-05-30 MED ORDER — ONDANSETRON 4 MG PO TBDP: ORAL_TABLET | ORAL | Status: AC

## 2016-05-30 NOTE — ED Notes (Signed)
Pt c/o headache with vomiting

## 2016-05-30 NOTE — Discharge Instructions (Signed)

## 2016-05-30 NOTE — ED Provider Notes (Signed)
CSN: [id number]     Arrival date & time 05/30/16  0012 History  By signing my name below, I, Ronney LionSuzanne Le, attest that this documentation has been prepared under the direction and in the presence of Zadie Rhineonald Careen Mauch, MD. Electronically Signed: Ronney LionSuzanne Le, ED Scribe. 05/30/2016. 12:45 AM.   Chief Complaint  Patient presents with  . Emesis   Patient is a 25 y.o. female presenting with vomiting. The history is provided by the patient. No language interpreter was used.  Emesis Severity:  Moderate Duration:  1 day Timing:  Intermittent Number of daily episodes:  3 Context: not post-tussive and not self-induced   Relieved by:  Nothing Worsened by:  Nothing tried Ineffective treatments:  None tried Associated symptoms: abdominal pain, diarrhea and headaches   Associated symptoms: no fever   Risk factors: no travel to endemic areas     HPI Comments: Melinda Valenzuela is a 25 y.o. female with no pertinent past medical history, who presents to the Emergency Department complaining of 3 episodes of emesis since this morning. She also complains of a headache that began about 7 hours ago (after her vomiting), diarrhea, and generalized abdominal pain. She reports she had a "knot" on the back of her head for 2 months that has been causing pain. 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 No one else at home is sick. She denies recent travel or tick bites. She denies a history of chronic medical conditions. Patient reports a history of tubal ligation and cholecystectomy but otherwise denies any abdominal surgeries. She denies hematemesis, fever, chest pain, vaginal bleeding, vaginal discharge, dysuria.   Past Medical History  Diagnosis Date  . HSV-2 (herpes simplex virus 2) infection   . Abnormal Pap smear   . Headache(784.0)   . Chlamydia   . Hemorrhoids 04/26/2013   Past Surgical History  Procedure Laterality Date  . Dilation  and curettage of uterus    . Tubal ligation Bilateral 04/15/2013    Procedure: POST PARTUM TUBAL LIGATION;  Surgeon: Catalina AntiguaPeggy Constant, MD;  Location: [facility];  Service: Gynecology;  Laterality: Bilateral;  . Cholecystectomy     Family History  Problem Relation Age of Onset  . Cancer Other     colon  . Stroke Maternal Grandfather   . Cancer Father     colon  . Stroke Mother   . Hypertension Mother    Social History  Substance Use Topics  . Smoking status: Current Every Day Smoker -- 0.50 packs/day    Types: Cigarettes  . Smokeless tobacco: Former NeurosurgeonUser    Quit date: 01/29/2012  . Alcohol Use: Yes     Comment: occasionally liquor   OB History    Gravida Para Term Preterm AB TAB SAB Ectopic Multiple Living   4 3 2 1 1  0 0 0 0 3     Review of Systems  Constitutional: Negative for fever.  Cardiovascular: Negative for chest pain.  Gastrointestinal: Positive for nausea, vomiting, abdominal pain and diarrhea.  Genitourinary: Negative for dysuria, vaginal bleeding and vaginal discharge.  Neurological: Positive for headaches.  All other systems reviewed and are negative.  Allergies  Review of patient's allergies indicates no known allergies.  Home Medications   Prior to Admission medications   Medication Sig Start Date End Date Taking? Authorizing Provider  hydrochlorothiazide (MICROZIDE) 12.5 MG capsule Take 1 capsule (12.5 mg total) by mouth daily. 05/12/15   Ivin BootyJoshua  Jodi Mourning, MD  ondansetron (ZOFRAN ODT) 4 MG disintegrating tablet  ODT q4 hours prn nausea/vomit 05/30/16   Zadie Rhine, MD   BP 126/83 mmHg  Pulse 62  Temp(Src) 98.2 F (36.8 C) (Oral)  Resp 18  Ht  (1.702 m)  Wt 242 lb (109.77 kg)  BMI 37.89 kg/m2  SpO2 96%  LMP 04/29/2016 Physical Exam  Nursing note and vitals reviewed.   CONSTITUTIONAL: Well developed/well nourished HEAD: Normocephalic/atraumatic; sebaceous cyst to posterior scalp, no fluctuance noted EYES: EOMI/PERRL; no icterus ENMT: Mucous  membranes dry NECK: supple no meningeal signs SPINE/BACK:entire spine nontender CV: S1/S2 noted, no murmurs/rubs/gallops noted LUNGS: Lungs are clear to auscultation bilaterally, no apparent distress ABDOMEN: soft, nontender, no rebound or guarding, bowel sounds noted throughout abdomen GU:no cva tenderness NEURO: Pt is awake/alert/appropriate, moves all extremitiesx4.  No facial droop.   EXTREMITIES: pulses normal/equal, full ROM SKIN: warm, color normal PSYCH: no abnormalities of mood noted, alert and oriented to situation   ED Course  Procedures  DIAGNOSTIC STUDIES: Oxygen Saturation is 96% on RA, normal by my interpretation.    COORDINATION OF CARE: 12:40 AM - Suspect sebaceous cyst on posterior head. Discussed treatment plan with pt at bedside which includes nausea medication and fluids. Pt verbalized understanding and agreed to plan.   Labs Review Labs Reviewed  URINALYSIS, ROUTINE W REFLEX MICROSCOPIC (NOT AT [facility])  POC URINE PREG, ED    I have personally reviewed and evaluated these lab results as part of my medical decision-making.  Pt improved No vomiting/diarrhea here She is currently sleeping She did not have signs of acute surgical abdomen She reports HA but reports it is from cyst on her scalp that has been present for months Referred to surgery for definitive removal   Medications  ondansetron (ZOFRAN-ODT) disintegrating tablet 8 mg (8 mg Oral Given 05/30/16 0048)  ibuprofen (ADVIL,MOTRIN) tablet 800 mg (800 mg Oral Given 05/30/16 0119)     MDM   Final diagnoses:  Vomiting and diarrhea  Sebaceous cyst    Nursing notes including past medical history and social history reviewed and considered in documentation Labs/vital reviewed myself and considered during evaluation   I personally performed the services described in this documentation, which was scribed in my presence. The recorded information has been reviewed and is accurate.       Zadie Rhine, MD 05/30/16 [phone]

## 2016-11-24 ENCOUNTER — Other Ambulatory Visit: Payer: Self-pay | Admitting: Obstetrics & Gynecology

## 2016-12-06 ENCOUNTER — Other Ambulatory Visit: Payer: Self-pay | Admitting: Obstetrics & Gynecology

## 2017-01-19 ENCOUNTER — Emergency Department (HOSPITAL_COMMUNITY)
Admission: EM | Admit: 2017-01-19 | Discharge: 2017-01-19 | Disposition: A | Discharge status: Home / Self Care | Payer: 59 | Attending: Emergency Medicine | Admitting: Emergency Medicine

## 2017-01-19 ENCOUNTER — Encounter (HOSPITAL_COMMUNITY): Payer: Self-pay

## 2017-01-19 DIAGNOSIS — F17210 Nicotine dependence, cigarettes, uncomplicated: Secondary | ICD-10-CM | POA: Insufficient documentation

## 2017-01-19 DIAGNOSIS — Z79899 Other long term (current) drug therapy: Secondary | ICD-10-CM | POA: Insufficient documentation

## 2017-01-19 DIAGNOSIS — L089 Local infection of the skin and subcutaneous tissue, unspecified: Secondary | ICD-10-CM

## 2017-01-19 DIAGNOSIS — L723 Sebaceous cyst: Secondary | ICD-10-CM | POA: Insufficient documentation

## 2017-01-19 MED ORDER — LIDOCAINE HCL (PF) 1 % IJ SOLN
5.0000 mL | Freq: Once | INTRAMUSCULAR | Status: AC
  Administered 2017-01-19: 5 mL via INTRADERMAL
  Filled 2017-01-19: qty 5

## 2017-01-19 MED ORDER — LIDOCAINE-EPINEPHRINE-TETRACAINE (LET) SOLUTION
3.0000 mL | Freq: Once | NASAL | Status: AC
  Administered 2017-01-19: 3 mL via TOPICAL
  Filled 2017-01-19: qty 3

## 2017-01-19 NOTE — ED Provider Notes (Signed)
[facility]-EMERGENCY DEPT Provider Note   CSN: [id number] Arrival date & time: 01/19/17  0818     History   Chief Complaint Chief Complaint  Patient presents with  . Abscess    HPI  Blood pressure 141/84, pulse 79, temperature 98 F (36.7 C), temperature source Oral, resp. rate 16, last menstrual period 12/26/2016, SpO2 99 %.  Melinda Valenzuela is a 26 y.o. female complaining of Painful swelling to occipital area worsening over the course of 2 months. Some discharge from the area over the last 2 days. No fever, chills, nausea, vomiting, no similar prior episodes.  Past Medical History:  Diagnosis Date  . Abnormal Pap smear   . Chlamydia   . Headache(784.0)   . Hemorrhoids 04/26/2013  . HSV-2 (herpes simplex virus 2) infection     Patient Active Problem List   Diagnosis Date Noted  . Mild dysplasia of cervix 04/22/2014  . Hemorrhoids 04/26/2013  . Sterilization 04/15/2013  . Migraine 03/28/2013  . HSV-2 (herpes simplex virus 2) infection 03/13/2013  . Supervision of normal pregnancy 05/22/2012    Past Surgical History:  Procedure Laterality Date  . CHOLECYSTECTOMY    . DILATION AND CURETTAGE OF UTERUS    . TUBAL LIGATION Bilateral 04/15/2013   Procedure: POST PARTUM TUBAL LIGATION;  Surgeon: Catalina AntiguaPeggy Constant, MD;  Location: [facility];  Service: Gynecology;  Laterality: Bilateral;    OB History    Gravida Para Term Preterm AB Living   4 3 2 1 1 3    SAB TAB Ectopic Multiple Live Births   0 0 0 0 3       Home Medications    Prior to Admission medications   Medication Sig Start Date End Date Taking? Authorizing Provider  hydrochlorothiazide (MICROZIDE) 12.5 MG capsule Take 1 capsule (12.5 mg total) by mouth daily. Patient not taking: Reported on 01/19/2017 05/12/15   Blane OharaJoshua Zavitz, MD  ondansetron (ZOFRAN ODT) 4 MG disintegrating tablet 4mg  ODT q4 hours prn nausea/vomit Patient not taking: Reported on 01/19/2017 05/30/16   Zadie Rhineonald Wickline, MD    Family History Family  History  Problem Relation Age of Onset  . Stroke Maternal Grandfather   . Cancer Father     colon  . Stroke Mother   . Hypertension Mother   . Cancer Other     colon    Social History Social History  Substance Use Topics  . Smoking status: Current Every Day Smoker    Packs/day: 0.50    Types: Cigarettes  . Smokeless tobacco: Former NeurosurgeonUser    Quit date: 01/29/2012  . Alcohol use Yes     Comment: occasionally liquor     Allergies   Patient has no known allergies.   Review of Systems Review of Systems  10 systems reviewed and found to be negative, except as noted in the HPI.   Physical Exam Updated Vital Signs BP 121/82   Pulse 74   Temp 98 F (36.7 C) (Oral)   Resp 16   LMP 12/26/2016   SpO2 96%   Physical Exam  Constitutional: She is oriented to person, place, and time. She appears well-developed and well-nourished. No distress.  HENT:  Head: Normocephalic and atraumatic.    Mouth/Throat: Oropharynx is clear and moist.  Eyes: Conjunctivae and EOM are normal. Pupils are equal, round, and reactive to light.  Neck: Normal range of motion.  Cardiovascular: Normal rate, regular rhythm and intact distal pulses.   Pulmonary/Chest: Effort normal and breath sounds normal.  Abdominal:  Soft. There is no tenderness.  Musculoskeletal: Normal range of motion.  Neurological: She is alert and oriented to person, place, and time.  Skin: She is not diaphoretic.  Psychiatric: She has a normal mood and affect.  Nursing note and vitals reviewed.    ED Treatments / Results  Labs (all labs ordered are listed, but only abnormal results are displayed) Labs Reviewed - No data to display  EKG  EKG Interpretation None       Radiology No results found.  Procedures .Marland KitchenIncision and Drainage Date/Time: 01/19/2017 9:56 AM Performed by: Wynetta Emery Authorized by: Wynetta Emery   Consent:    Consent obtained:  Verbal   Consent given by:  Patient Location:     Type:  Cyst   Size:  2 cm   Location:  Head   Head location:  Scalp Pre-procedure details:    Skin preparation:  Chloraprep Anesthesia (see MAR for exact dosages):    Anesthesia method:  Topical application and local infiltration   Topical anesthetic:  LET   Local anesthetic:  Lidocaine 1% w/o epi Procedure type:    Complexity:  Complex Procedure details:    Needle aspiration: no     Incision types:  Single straight   Incision depth:  Dermal   Scalpel blade:  11   Wound management:  Probed and deloculated, irrigated with saline and extensive cleaning   Drainage characteristics: foul smelling sebaceous    Drainage amount:  Copious   Wound treatment:  Wound left open   Packing materials:  None Post-procedure details:    Patient tolerance of procedure:  Tolerated well, no immediate complications   (including critical care time)  Medications Ordered in ED Medications  lidocaine-EPINEPHrine-tetracaine (LET) solution (3 mLs Topical Given 01/19/17 0927)  lidocaine (PF) (XYLOCAINE) 1 % injection 5 mL (5 mLs Intradermal Given 01/19/17 1610)     Initial Impression / Assessment and Plan / ED Course  I have reviewed the triage vital signs and the nursing notes.  Pertinent labs & imaging results that were available during my care of the patient were reviewed by me and considered in my medical decision making (see chart for details).     Vitals:   01/19/17 0836 01/19/17 0845 01/19/17 0900 01/19/17 0915  BP: 141/84 128/82 132/83 121/82  Pulse: 79 73 78 74  Resp: 16   16  Temp: 98 F (36.7 C)     TempSrc: Oral     SpO2: 99% 99% 97% 96%    Medications  lidocaine-EPINEPHrine-tetracaine (LET) solution (3 mLs Topical Given 01/19/17 0927)  lidocaine (PF) (XYLOCAINE) 1 % injection 5 mL (5 mLs Intradermal Given 01/19/17 9604)    Melinda Valenzuela is 26 y.o. female presenting with Infected sebaceous cysts, I and D performed with a copious amount of foul-smelling material expressed,  irrigated out the cavity. Counseled patient on wound care and return precautions. No signs of systemic infection.  Evaluation does not show pathology that would require ongoing emergent intervention or inpatient treatment. Pt is hemodynamically stable and mentating appropriately. Discussed findings and plan with patient/guardian, who agrees with care plan. All questions answered. Return precautions discussed and outpatient follow up given.    Final Clinical Impressions(s) / ED Diagnoses   Final diagnoses:  Infected sebaceous cyst    New Prescriptions New Prescriptions   No medications on file     Wynetta Emery, PA-C 01/19/17 1004    Geoffery Lyons, MD 01/19/17 1435

## 2017-01-19 NOTE — ED Triage Notes (Signed)
Pt c/o abscess on back on head. Pt states the abscess is painful and itchy. Pt does endorse some drainage. Pt denies fevers.

## 2017-02-08 ENCOUNTER — Emergency Department (HOSPITAL_COMMUNITY)
Admission: EM | Admit: 2017-02-08 | Discharge: 2017-02-08 | Disposition: A | Discharge status: Home / Self Care | Payer: Self-pay | Attending: Emergency Medicine | Admitting: Emergency Medicine

## 2017-02-08 ENCOUNTER — Emergency Department (HOSPITAL_COMMUNITY): Payer: Self-pay

## 2017-02-08 ENCOUNTER — Encounter (HOSPITAL_COMMUNITY): Payer: Self-pay | Admitting: *Deleted

## 2017-02-08 DIAGNOSIS — M7989 Other specified soft tissue disorders: Secondary | ICD-10-CM | POA: Insufficient documentation

## 2017-02-08 DIAGNOSIS — R229 Localized swelling, mass and lump, unspecified: Secondary | ICD-10-CM

## 2017-02-08 DIAGNOSIS — F17210 Nicotine dependence, cigarettes, uncomplicated: Secondary | ICD-10-CM | POA: Insufficient documentation

## 2017-02-08 MED ORDER — IBUPROFEN 800 MG PO TABS: 800.0000 mg | ORAL_TABLET | Freq: Three times a day (TID) | ORAL | 0 refills | Status: AC

## 2017-02-08 NOTE — ED Notes (Signed)
Signature pad not functioning in room. Pt verbalizes understanding and requests discharge instead of waiting for care management

## 2017-02-08 NOTE — Discharge Instructions (Signed)
Please follow-up with the clinic listed below.    They will make any referrals that may be needed.  In the mean time, use the sling and take anti-inflammatories.

## 2017-02-08 NOTE — ED Provider Notes (Signed)
[facility] Provider Note   CSN: [id number] Arrival date & time: 02/08/17  1310   By signing my name below, I, Teofilo Pod, attest that this documentation has been prepared under the direction and in the presence of Roxy Horseman, PA-C. Electronically Signed: Teofilo Pod, ED Scribe. 02/08/2017. 2:52 PM.   History   Chief Complaint Chief Complaint  Patient presents with  . Pain    The history is provided by the patient. No language interpreter was used.   HPI Comments:  Melinda Valenzuela is a 26 y.o. female who presents to the Emergency Department complaining of constant pain to her upper left chest/shoulder x 1 month. Pt states that the pain has improved mildly over the past month, and states that she can feel a "knot" near her left shoulder. Pt states that the pain is worse with palpation and with ROM, and is non radiating. Pt also complains of right ear pain and sore throat on the right side. Pt was seen here 3 weeks ago to have cyst drained on the right side of her face. No alleviating factors noted. Pt denies other associated symptoms.    Past Medical History:  Diagnosis Date  . Abnormal Pap smear   . Chlamydia   . Headache(784.0)   . Hemorrhoids 04/26/2013  . HSV-2 (herpes simplex virus 2) infection     Patient Active Problem List   Diagnosis Date Noted  . Mild dysplasia of cervix 04/22/2014  . Hemorrhoids 04/26/2013  . Sterilization 04/15/2013  . Migraine 03/28/2013  . HSV-2 (herpes simplex virus 2) infection 03/13/2013  . Supervision of normal pregnancy 05/22/2012    Past Surgical History:  Procedure Laterality Date  . CHOLECYSTECTOMY    . DILATION AND CURETTAGE OF UTERUS    . TUBAL LIGATION Bilateral 04/15/2013   Procedure: POST PARTUM TUBAL LIGATION;  Surgeon: Catalina Antigua, MD;  Location: [facility];  Service: Gynecology;  Laterality: Bilateral;    OB History    Gravida Para Term Preterm AB Living   4 3 2 1 1 3    SAB TAB Ectopic  Multiple Live Births   0 0 0 0 3       Home Medications    Prior to Admission medications   Medication Sig Start Date End Date Taking? Authorizing Provider  hydrochlorothiazide (MICROZIDE) 12.5 MG capsule Take 1 capsule (12.5 mg total) by mouth daily. Patient not taking: Reported on 01/19/2017 05/12/15   Blane Ohara, MD  ondansetron [facility] ODT) 4 MG disintegrating tablet 4mg  ODT q4 hours prn nausea/vomit Patient not taking: Reported on 01/19/2017 05/30/16   Zadie Rhine, MD    Family History Family History  Problem Relation Age of Onset  . Stroke Maternal Grandfather   . Cancer Father     colon  . Stroke Mother   . Hypertension Mother   . Cancer Other     colon    Social History Social History  Substance Use Topics  . Smoking status: Current Every Day Smoker    Packs/day: 0.50    Types: Cigarettes  . Smokeless tobacco: Former Neurosurgeon    Quit date: 01/29/2012  . Alcohol use Yes     Comment: occasionally liquor     Allergies   Patient has no known allergies.   Review of Systems Review of Systems  HENT: Positive for ear pain and sore throat.   Musculoskeletal: Positive for arthralgias and myalgias.  Neurological: Negative for numbness.     Physical Exam Updated Vital Signs  BP (!) 151/102   Pulse 78   Temp 98.5 F (36.9 C) (Oral)   Resp 18   LMP 01/19/2017   SpO2 97%   Physical Exam  Constitutional: She appears well-developed and well-nourished. No distress.  HENT:  Head: Normocephalic and atraumatic.  Eyes: Conjunctivae are normal.  Cardiovascular: Normal rate, regular rhythm and normal heart sounds.   Pulmonary/Chest: Effort normal and breath sounds normal. No respiratory distress. She has no wheezes. She has no rales. She exhibits no tenderness.  Very slight subtle soft tissue swelling over left chest wall, which is tender  Abdominal: She exhibits no distension.  Neurological: She is alert.  Skin: Skin is warm and dry.  Psychiatric: She has a  normal mood and affect.  Nursing note and vitals reviewed.    ED Treatments / Results  DIAGNOSTIC STUDIES:  Oxygen Saturation is 97% on RA, normal by my interpretation.    COORDINATION OF CARE:  2:52 PM Will order xray. Discussed treatment plan with pt at bedside and pt agreed to plan.   Labs (all labs ordered are listed, but only abnormal results are displayed) Labs Reviewed - No data to display  EKG  EKG Interpretation None       Radiology Dg Chest 2 View  Result Date: 02/08/2017 CLINICAL DATA:  Mass on left chest wall. EXAM: CHEST  2 VIEW COMPARISON:  09/14/2015 FINDINGS: No visible soft tissue mass radiographically. Heart and mediastinal contours are within normal limits. No focal opacities or effusions. No acute bony abnormality. IMPRESSION: No active cardiopulmonary disease. No visible soft tissue mass. If there is clinical concern for mass, this would be better evaluated with CT or mammography, depending on its location. Electronically Signed   By: Charlett NoseKevin  Dover M.D.   On: 02/08/2017 15:21    Procedures Procedures (including critical care time)  Medications Ordered in ED Medications - No data to display   Initial Impression / Assessment and Plan / ED Course  I have reviewed the triage vital signs and the nursing notes.  Pertinent labs & imaging results that were available during my care of the patient were reviewed by me and considered in my medical decision making (see chart for details).     Patient with complaints of painful, subtle mass on left upper chest wall. She states that it has been present for the past several weeks. She denies any known injury. It is worsened with palpation, and with chest flexion. She denies any redness, warmth, discharge. I have a very low suspicion for abscess or cellulitis. I doubt that this is a breast malignancy even the size, and am more suspicious for musculoskeletal injury, and possible pectoralis strain. I discussed case with  Dr. Verdie MosherLiu, who agrees with plan for close follow-up. I have asked that the case manager assist with getting the patient close follow-up.  Patient understands and agrees the plan. She is stable and ready for discharge.  Final Clinical Impressions(s) / ED Diagnoses   Final diagnoses:  Soft tissue swelling    New Prescriptions New Prescriptions   No medications on file   I personally performed the services described in this documentation, which was scribed in my presence. The recorded information has been reviewed and is accurate.       Roxy Horsemanobert Tanae Petrosky, PA-C 02/08/17 1615    Lavera Guiseana Duo Liu, MD 02/09/17 [phone]

## 2017-02-08 NOTE — ED Triage Notes (Signed)
Pt reports having hematoma to left side of upper chest. Increases with pain and palpation. Denies injury.

## 2017-02-08 NOTE — Discharge Planning (Signed)
Benicio Manna J. Lucretia RoersWood, RN, BSN, [place] [phone] [facility] with Julianne HandlerLachina Hollis. NP on 3/21.  Spoke with pt at bedside and advised to please arrive 15 min early and take a picture ID and your current medications.  Pt verbalizes understanding of keeping appointment.

## 2017-03-15 ENCOUNTER — Ambulatory Visit: Payer: 59 | Admitting: Family Medicine

## 2017-07-29 ENCOUNTER — Emergency Department (HOSPITAL_COMMUNITY)
Admission: EM | Admit: 2017-07-29 | Discharge: 2017-07-29 | Disposition: A | Discharge status: Home / Self Care | Payer: 59 | Attending: Emergency Medicine | Admitting: Emergency Medicine

## 2017-07-29 ENCOUNTER — Emergency Department (HOSPITAL_COMMUNITY): Payer: 59

## 2017-07-29 ENCOUNTER — Encounter (HOSPITAL_COMMUNITY): Payer: Self-pay | Admitting: Emergency Medicine

## 2017-07-29 DIAGNOSIS — F17210 Nicotine dependence, cigarettes, uncomplicated: Secondary | ICD-10-CM | POA: Insufficient documentation

## 2017-07-29 DIAGNOSIS — M79672 Pain in left foot: Secondary | ICD-10-CM

## 2017-07-29 DIAGNOSIS — Z791 Long term (current) use of non-steroidal anti-inflammatories (NSAID): Secondary | ICD-10-CM | POA: Insufficient documentation

## 2017-07-29 DIAGNOSIS — Z79899 Other long term (current) drug therapy: Secondary | ICD-10-CM | POA: Insufficient documentation

## 2017-07-29 MED ORDER — DICLOFENAC SODIUM 50 MG PO TBEC: 50.0000 mg | DELAYED_RELEASE_TABLET | Freq: Two times a day (BID) | ORAL | 0 refills | Status: AC

## 2017-07-29 MED ORDER — OXYCODONE-ACETAMINOPHEN 5-325 MG PO TABS
ORAL_TABLET | ORAL | Status: AC
  Filled 2017-07-29: qty 1

## 2017-07-29 MED ORDER — HYDROCODONE-ACETAMINOPHEN 5-325 MG PO TABS
1.0000 | ORAL_TABLET | Freq: Once | ORAL | Status: AC
  Administered 2017-07-29: 1 via ORAL
  Filled 2017-07-29: qty 1

## 2017-07-29 MED ORDER — OXYCODONE-ACETAMINOPHEN 5-325 MG PO TABS
1.0000 | ORAL_TABLET | ORAL | Status: DC | PRN
  Administered 2017-07-29: 1 via ORAL

## 2017-07-29 NOTE — ED Triage Notes (Signed)
Pt presents with L foot pain after horseplaying with her sister; states she fell and felt a pop in the left foot; pt was able to ambulate on foot at first but pain worsened; states injury occurred 1 hr PTA; pain in the lateral aspect of left ankle; PMS intact distally

## 2017-07-29 NOTE — ED Provider Notes (Signed)
[facility]-EMERGENCY DEPT Provider Note   CSN: [id number] Arrival date & time: 07/29/17  1933     History   Chief Complaint Chief Complaint  Patient presents with  . Foot Pain    HPI Melinda Valenzuela is a 26 y.o. female who presents to the ED with left foot pain that started suddenly while she and her sister were horse playing. The pain is located on the lateral aspect of the left foot. The pain is moderate. The patient tried soaking her foot in warm water but it did not help.   The history is provided by the patient.  Foot Pain  This is a new problem. The current episode started 1 to 2 hours ago. The problem occurs constantly. Pertinent negatives include no headaches. The symptoms are aggravated by standing.    Past Medical History:  Diagnosis Date  . Abnormal Pap smear   . Chlamydia   . Headache(784.0)   . Hemorrhoids 04/26/2013  . HSV-2 (herpes simplex virus 2) infection     Patient Active Problem List   Diagnosis Date Noted  . Mild dysplasia of cervix 04/22/2014  . Hemorrhoids 04/26/2013  . Sterilization 04/15/2013  . Migraine 03/28/2013  . HSV-2 (herpes simplex virus 2) infection 03/13/2013  . Supervision of normal pregnancy 05/22/2012    Past Surgical History:  Procedure Laterality Date  . CHOLECYSTECTOMY    . DILATION AND CURETTAGE OF UTERUS    . TUBAL LIGATION Bilateral 04/15/2013   Procedure: POST PARTUM TUBAL LIGATION;  Surgeon: Catalina AntiguaPeggy Constant, MD;  Location: [facility];  Service: Gynecology;  Laterality: Bilateral;    OB History    Gravida Para Term Preterm AB Living   4 3 2 1 1 3    SAB TAB Ectopic Multiple Live Births   0 0 0 0 3       Home Medications    Prior to Admission medications   Medication Sig Start Date End Date Taking? Authorizing Provider  diclofenac (VOLTAREN) 50 MG EC tablet Take 1 tablet (50 mg total) by mouth 2 (two) times daily. 07/29/17   Janne NapoleonNeese, Alantis Bethune M, NP  hydrochlorothiazide (MICROZIDE) 12.5 MG capsule Take 1 capsule (12.5 mg total) by  mouth daily. Patient not taking: Reported on 01/19/2017 05/12/15   Blane OharaZavitz, Joshua, MD  ibuprofen (ADVIL,MOTRIN) 800 MG tablet Take 1 tablet (800 mg total) by mouth 3 (three) times daily. 02/08/17   Roxy HorsemanBrowning, Robert, PA-C  ondansetron (ZOFRAN ODT) 4 MG disintegrating tablet 4mg  ODT q4 hours prn nausea/vomit Patient not taking: Reported on 01/19/2017 05/30/16   Zadie RhineWickline, Donald, MD    Family History Family History  Problem Relation Age of Onset  . Stroke Maternal Grandfather   . Cancer Father        colon  . Stroke Mother   . Hypertension Mother   . Cancer Other        colon    Social History Social History  Substance Use Topics  . Smoking status: Current Every Day Smoker    Packs/day: 0.50    Types: Cigarettes  . Smokeless tobacco: Former NeurosurgeonUser    Quit date: 01/29/2012  . Alcohol use Yes     Comment: occasionally liquor     Allergies   Patient has no known allergies.   Review of Systems Review of Systems  Gastrointestinal: Negative for nausea and vomiting.  Musculoskeletal: Positive for arthralgias.       Left foot  Skin: Negative for wound.  Neurological: Negative for syncope and headaches.  Physical Exam Updated Vital Signs BP (!) 133/91   Pulse 88   Temp 99.3 F (37.4 C) (Oral)   Resp 20   Ht 5\' 7"  (1.702 m)   Wt 109.8 kg (242 lb)   LMP 06/30/2017   SpO2 100%   BMI 37.90 kg/m   Physical Exam  Constitutional: She appears well-developed and well-nourished. No distress.  HENT:  Head: Normocephalic.  Eyes: EOM are normal.  Neck: Neck supple.  Cardiovascular: Normal rate.   Pulmonary/Chest: Effort normal.  Abdominal: Soft. There is no tenderness.  Musculoskeletal:       Left foot: There is tenderness. There is normal capillary refill, no deformity and no laceration. Decreased range of motion: due to pain. Swelling: minimal.       Feet:  Tender with palpation and range of motion of the lateral aspect of the left foot.   Left ankle with full range of  motion and no pain.   Neurological: She is alert.  Skin: Skin is warm and dry.  Psychiatric: She has a normal mood and affect. Her behavior is normal.  Nursing note and vitals reviewed.    ED Treatments / Results  Labs (all labs ordered are listed, but only abnormal results are displayed) Labs Reviewed - No data to display  EKG  EKG Interpretation None       Radiology Dg Ankle Complete Left  Result Date: 07/29/2017 CLINICAL DATA:  Left foot injury, pain. EXAM: LEFT ANKLE COMPLETE - 3+ VIEW COMPARISON:  None. FINDINGS: Osseous alignment is normal. Ankle mortise is symmetric. No fracture line or displaced fracture fragment seen. Adjacent soft tissues are unremarkable. IMPRESSION: Negative. Electronically Signed   By: Bary RichardStan  Maynard M.D.   On: 07/29/2017 20:11   Dg Foot Complete Left  Result Date: 07/29/2017 CLINICAL DATA:  Injury, pain. EXAM: LEFT FOOT - COMPLETE 3+ VIEW COMPARISON:  None. FINDINGS: Osseous alignment is normal. No fracture line or displaced fracture fragment seen. Adjacent soft tissues are unremarkable. IMPRESSION: Negative. Electronically Signed   By: Bary RichardStan  Maynard M.D.   On: 07/29/2017 20:11    Procedures Procedures (including critical care time)  Medications Ordered in ED Medications  oxyCODONE-acetaminophen (PERCOCET/ROXICET) 5-325 MG per tablet 1 tablet (1 tablet Oral Given 07/29/17 1946)  oxyCODONE-acetaminophen (PERCOCET/ROXICET) 5-325 MG per tablet (not administered)  HYDROcodone-acetaminophen (NORCO/VICODIN) 5-325 MG per tablet 1 tablet (not administered)     Initial Impression / Assessment and Plan / ED Course  I have reviewed the triage vital signs and the nursing notes.  Pertinen imaging results that were available during my care of the patient were reviewed by me and considered in my medical decision making (see chart for details). Patient stable for discharge without focal neuro deficits. Ace wrap, ice, elevation and crutches. Return precautions  discussed.   Final Clinical Impressions(s) / ED Diagnoses   Final diagnoses:  Foot pain, left    New Prescriptions New Prescriptions   DICLOFENAC (VOLTAREN) 50 MG EC TABLET    Take 1 tablet (50 mg total) by mouth 2 (two) times daily.     Kerrie Buffaloeese, Kyshon Tolliver [place] Springs[place], [place] 07/29/17 2123    Arby BarrettePfeiffer, Marcy, MD 07/29/17 [phone]

## 2017-12-20 ENCOUNTER — Emergency Department (HOSPITAL_COMMUNITY)
Admission: EM | Admit: 2017-12-20 | Discharge: 2017-12-21 | Disposition: A | Discharge status: Home / Self Care | Payer: 59 | Attending: Emergency Medicine | Admitting: Emergency Medicine

## 2017-12-20 ENCOUNTER — Encounter (HOSPITAL_COMMUNITY): Payer: Self-pay

## 2017-12-20 ENCOUNTER — Other Ambulatory Visit: Payer: Self-pay

## 2017-12-20 DIAGNOSIS — L089 Local infection of the skin and subcutaneous tissue, unspecified: Secondary | ICD-10-CM | POA: Insufficient documentation

## 2017-12-20 DIAGNOSIS — B9689 Other specified bacterial agents as the cause of diseases classified elsewhere: Secondary | ICD-10-CM | POA: Insufficient documentation

## 2017-12-20 DIAGNOSIS — N760 Acute vaginitis: Secondary | ICD-10-CM | POA: Insufficient documentation

## 2017-12-20 DIAGNOSIS — F17210 Nicotine dependence, cigarettes, uncomplicated: Secondary | ICD-10-CM | POA: Insufficient documentation

## 2017-12-20 DIAGNOSIS — Z79899 Other long term (current) drug therapy: Secondary | ICD-10-CM | POA: Insufficient documentation

## 2017-12-20 LAB — URINALYSIS, ROUTINE W REFLEX MICROSCOPIC
Bilirubin Urine: NEGATIVE
GLUCOSE, UA: NEGATIVE mg/dL
Hgb urine dipstick: NEGATIVE
Ketones, ur: NEGATIVE mg/dL
LEUKOCYTES UA: NEGATIVE
Nitrite: NEGATIVE
PH: 6 (ref 5.0–8.0)
PROTEIN: NEGATIVE mg/dL
SPECIFIC GRAVITY, URINE: 1.02 (ref 1.005–1.030)

## 2017-12-20 LAB — WET PREP, GENITAL
SPERM: NONE SEEN
Trich, Wet Prep: NONE SEEN
Yeast Wet Prep HPF POC: NONE SEEN

## 2017-12-20 LAB — PREGNANCY, URINE: Preg Test, Ur: NEGATIVE

## 2017-12-20 NOTE — ED Provider Notes (Signed)
[facility]   CSN: [id number] Arrival date & time: 12/20/17  1835     History   Chief Complaint Chief Complaint  Patient presents with  . Tattoo Irritation  . Vaginal Discharge    HPI Melinda Valenzuela is a 26 y.o. female.  26 year old female presents to the emergency department for evaluation of vaginal discharge.  She reports a thick white discharge for the past 2 weeks which has been worsening lately.  She took a Monistat tablet as she thought her symptoms were due to a yeast infection.  This was used 2 days ago and she does not believe that it helped her.  She has had some burning when her urine touches her outer labia.  She denies any other complaints of dysuria.  No hematuria, vaginal pain, dyspareunia.  Patient denies any vomiting or diarrhea.  No fevers.  She reports sexual activity with only 1 female partner in the past 5 years.  She has a secondary complaint of irritation to her left lower extremity at the site of a recent tattoo.  She states that she has been experiencing intermittent soreness associated with skin lesions which will occasionally drain.  She has not taken any medication for this and denies any red linear streaking.  No history of MRSA or abscess.      Past Medical History:  Diagnosis Date  . Abnormal Pap smear   . Chlamydia   . Headache(784.0)   . Hemorrhoids 04/26/2013  . HSV-2 (herpes simplex virus 2) infection     Patient Active Problem List   Diagnosis Date Noted  . Mild dysplasia of cervix 04/22/2014  . Hemorrhoids 04/26/2013  . Sterilization 04/15/2013  . Migraine 03/28/2013  . HSV-2 (herpes simplex virus 2) infection 03/13/2013  . Supervision of normal pregnancy 05/22/2012    Past Surgical History:  Procedure Laterality Date  . CHOLECYSTECTOMY    . DILATION AND CURETTAGE OF UTERUS    . TUBAL LIGATION Bilateral 04/15/2013   Procedure: POST PARTUM TUBAL LIGATION;  Surgeon: Catalina AntiguaPeggy  Constant, MD;  Location: [facility];  Service: Gynecology;  Laterality: Bilateral;    OB History    Gravida Para Term Preterm AB Living   4 3 2 1 1 3    SAB TAB Ectopic Multiple Live Births   0 0 0 0 3       Home Medications    Prior to Admission medications   Medication Sig Start Date End Date Taking? Authorizing Provider  diclofenac (VOLTAREN) 50 MG EC tablet Take 1 tablet (50 mg total) by mouth 2 (two) times daily. Patient not taking: Reported on 12/20/2017 07/29/17   Janne NapoleonNeese, Hope M, NP  hydrochlorothiazide (MICROZIDE) 12.5 MG capsule Take 1 capsule (12.5 mg total) by mouth daily. Patient not taking: Reported on 01/19/2017 05/12/15   Blane OharaZavitz, Joshua, MD  ibuprofen (ADVIL,MOTRIN) 800 MG tablet Take 1 tablet (800 mg total) by mouth 3 (three) times daily. Patient not taking: Reported on 12/20/2017 02/08/17   Roxy HorsemanBrowning, Robert, PA-C  metroNIDAZOLE (FLAGYL) 500 MG tablet Take 1 tablet (500 mg total) by mouth 2 (two) times daily. 12/21/17   Antony MaduraHumes, Avaeh Ewer, PA-C  mupirocin cream (BACTROBAN) 2 % Apply 1 application topically 2 (two) times daily. Apply to the affected area as prescribed 12/21/17   Antony MaduraHumes, Ninnie Fein, PA-C  ondansetron (ZOFRAN ODT) 4 MG disintegrating tablet 4mg  ODT q4 hours prn nausea/vomit Patient not taking: Reported on 01/19/2017 05/30/16   Zadie RhineWickline, Donald, MD  sulfamethoxazole-trimethoprim (BACTRIM DS,SEPTRA  DS) 800-160 MG tablet Take 1 tablet by mouth 2 (two) times daily for 7 days. 12/21/17 12/28/17  Antony MaduraHumes, Legrand Lasser, PA-C    Family History Family History  Problem Relation Age of Onset  . Stroke Maternal Grandfather   . Cancer Father        colon  . Stroke Mother   . Hypertension Mother   . Cancer Other        colon    Social History Social History   Tobacco Use  . Smoking status: Current Every Day Smoker    Packs/day: 0.50    Types: Cigarettes  . Smokeless tobacco: Former NeurosurgeonUser    Quit date: 01/29/2012  Substance Use Topics  . Alcohol use: Yes    Comment: occasionally liquor  .  Drug use: Yes    Types: Marijuana    Comment: former     Allergies   Patient has no known allergies.   Review of Systems Review of Systems Ten systems reviewed and are negative for acute change, except as noted in the HPI.    Physical Exam Updated Vital Signs BP (!) 141/98 (BP Location: Right Arm)   Pulse 61   Temp 98.4 F (36.9 C) (Oral)   Resp 18   Ht 5\' 7"  (1.702 m)   Wt 100.7 kg (222 lb)   LMP 11/11/2017   SpO2 100%   BMI 34.77 kg/m   Physical Exam  Constitutional: She is oriented to person, place, and time. She appears well-developed and well-nourished. No distress.  Nontoxic appearing and in NAD  HENT:  Head: Normocephalic and atraumatic.  Eyes: Conjunctivae and EOM are normal. No scleral icterus.  Neck: Normal range of motion.  Cardiovascular: Normal rate, regular rhythm and intact distal pulses.  Pulmonary/Chest: Effort normal. No stridor. No respiratory distress.  Respirations even and unlabored  Abdominal: Soft. She exhibits no distension and no mass. There is no tenderness. There is no guarding.  Soft, nontender, nondistended abdomen.  Genitourinary:  Genitourinary Comments: Thin white discharge. Cervical friability noted.  Musculoskeletal: Normal range of motion.  Neurological: She is alert and oriented to person, place, and time.  Skin: Skin is warm and dry. No rash noted. She is not diaphoretic. No pallor.  Scattered pustules to the left lateral lower extremity with associated induration, mild tenderness. No active drainage or red linear streaking.  Psychiatric: She has a normal mood and affect. Her behavior is normal.  Nursing note and vitals reviewed.    ED Treatments / Results  Labs (all labs ordered are listed, but only abnormal results are displayed) Labs Reviewed  WET PREP, GENITAL - Abnormal; Notable for the following components:      Result Value   Clue Cells Wet Prep HPF POC PRESENT (*)    WBC, Wet Prep HPF POC MANY (*)    All other  components within normal limits  URINALYSIS, ROUTINE W REFLEX MICROSCOPIC  PREGNANCY, URINE  GC/CHLAMYDIA PROBE AMP ([place]) NOT [facility]    EKG  EKG Interpretation None       Radiology No results found.  Procedures Procedures (including critical care time)  Medications Ordered in ED Medications - No data to display   Initial Impression / Assessment and Plan / ED Course  I have reviewed the triage vital signs and the nursing notes.  Pertinent labs & imaging results that were available during my care of the patient were reviewed by me and considered in my medical decision making (see chart for details).  26 year old female presents for vaginal discharge x 2 weeks.  Wet prep positive for clue cells consistent with bacterial vaginosis.  No trichomonas present.  Patient nice concern for STDs.  Gonorrhea and Chlamydia cultures pending.  Plan for discharge with Flagyl.  She is also complaining of sores to her left lower extremity around the site of the tattoo.  She has pustules concerning for soft tissue skin infection/abscess.  No indication for incision and drainage at this time.  Will start on Bactrim and topical Bactroban to try and help prevent further breakouts.  Tylenol and ibuprofen advised for discomfort.  Return precautions discussed and provided. Patient discharged in stable condition with no unaddressed concerns.   Final Clinical Impressions(s) / ED Diagnoses   Final diagnoses:  BV (bacterial vaginosis)  Skin pustule    ED Discharge Orders        Ordered    metroNIDAZOLE (FLAGYL) 500 MG tablet  2 times daily     12/21/17 0016    sulfamethoxazole-trimethoprim (BACTRIM DS,SEPTRA DS) 800-160 MG tablet  2 times daily     12/21/17 0016    mupirocin cream (BACTROBAN) 2 %  2 times daily     12/21/17 0017       Antony Madura, PA-C 12/21/17 0118    Azalia Bilis, MD 12/21/17 [id number]

## 2017-12-20 NOTE — ED Triage Notes (Signed)
Per Pt, Pt is coming from home with complaints of vaginal discharge for about two weeks. Reports that she attempted monistat and it didn't work. Also, complains of irritation to the left outer leg where her tattoo is placed about a month ago.

## 2017-12-21 LAB — GC/CHLAMYDIA PROBE AMP (~~LOC~~) NOT AT ARMC
CHLAMYDIA, DNA PROBE: NEGATIVE
Neisseria Gonorrhea: NEGATIVE

## 2017-12-21 MED ORDER — METRONIDAZOLE 500 MG PO TABS: 500.0000 mg | ORAL_TABLET | Freq: Two times a day (BID) | ORAL | 0 refills | Status: AC

## 2017-12-21 MED ORDER — MUPIROCIN CALCIUM 2 % EX CREA: 1.0000 "application " | TOPICAL_CREAM | Freq: Two times a day (BID) | CUTANEOUS | 0 refills | Status: AC

## 2017-12-21 MED ORDER — SULFAMETHOXAZOLE-TRIMETHOPRIM 800-160 MG PO TABS: 1.0000 | ORAL_TABLET | Freq: Two times a day (BID) | ORAL | 0 refills | Status: AC

## 2017-12-21 NOTE — Discharge Instructions (Signed)
Take Flagyl as prescribed for bacterial vaginosis.  Do not drink alcohol while taking this medication as it will make you very nauseous and cause you to vomit.  Follow-up with your primary care doctor or OB/GYN if discharge persists.  We recommend Bactrim for management of your leg sores.  Also apply topical Bactroban to the affected area as prescribed.  Take 500 mg Tylenol or 600 mg ibuprofen every 6 hours for discomfort.  Apply warm compresses to the area 3-4 times per day.

## 2018-07-05 IMAGING — DX DG FOOT COMPLETE 3+V*L*
3 series · 3 of 3 positions shown · non-contrast
Comparison: None.

CLINICAL DATA: Injury, pain.

EXAM:
LEFT FOOT - COMPLETE 3+ VIEW

[foot ap]
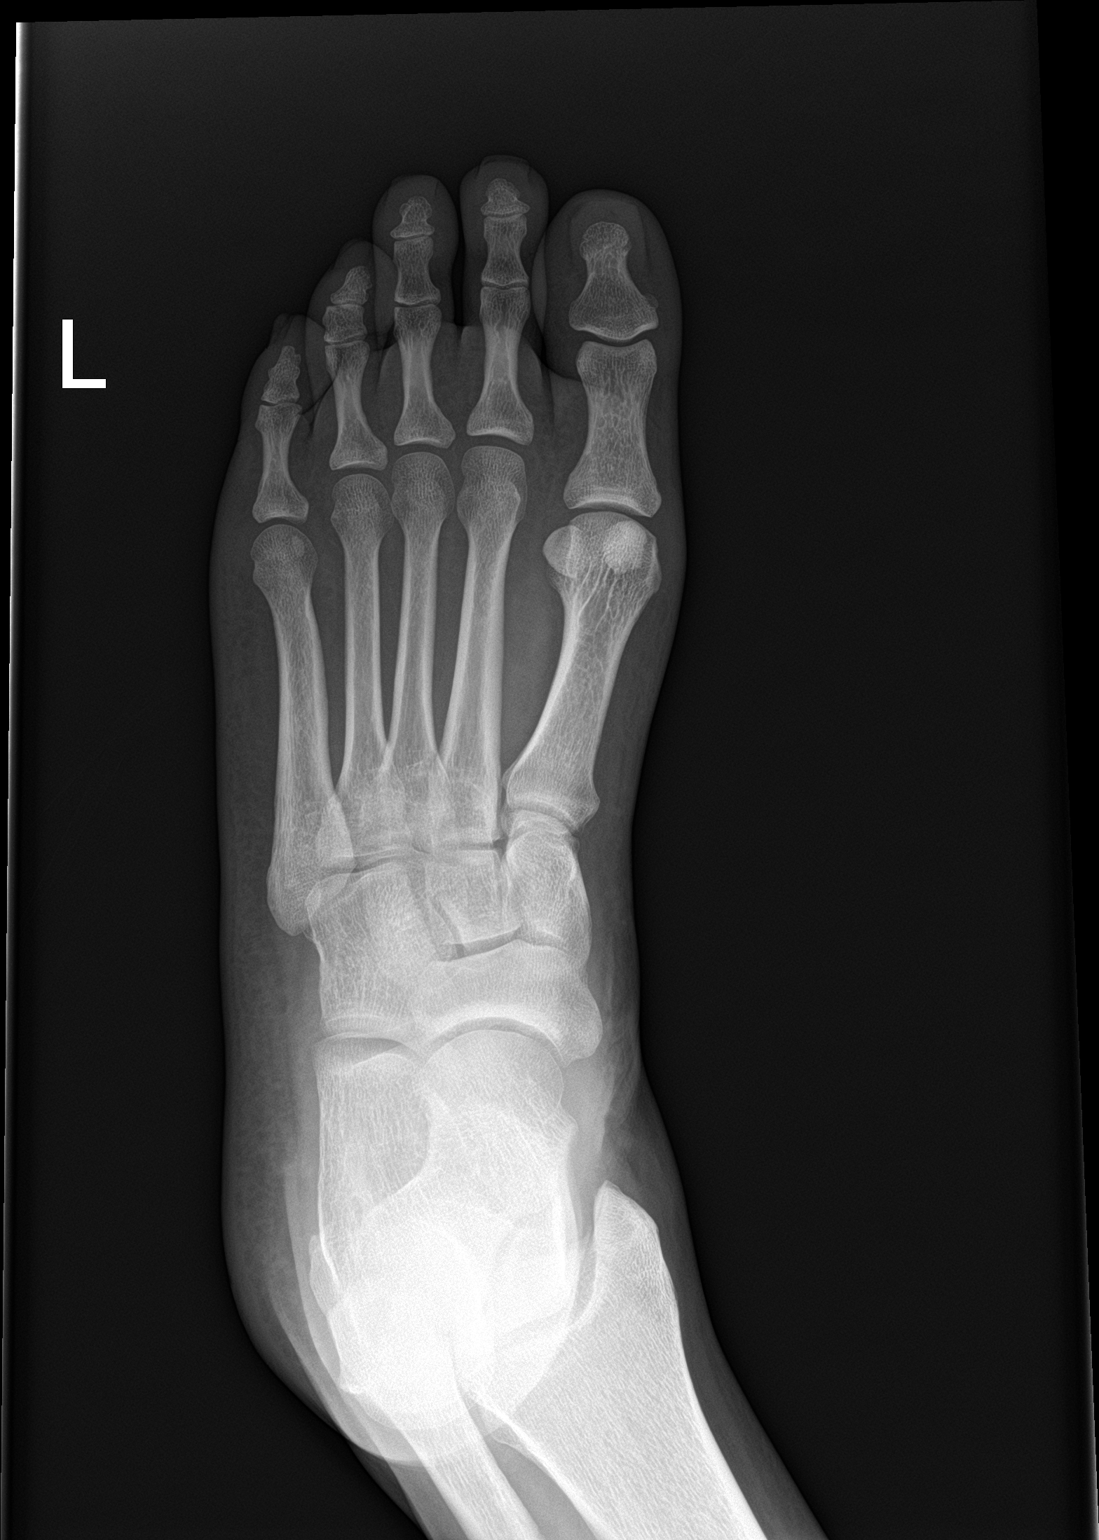

[foot obl]
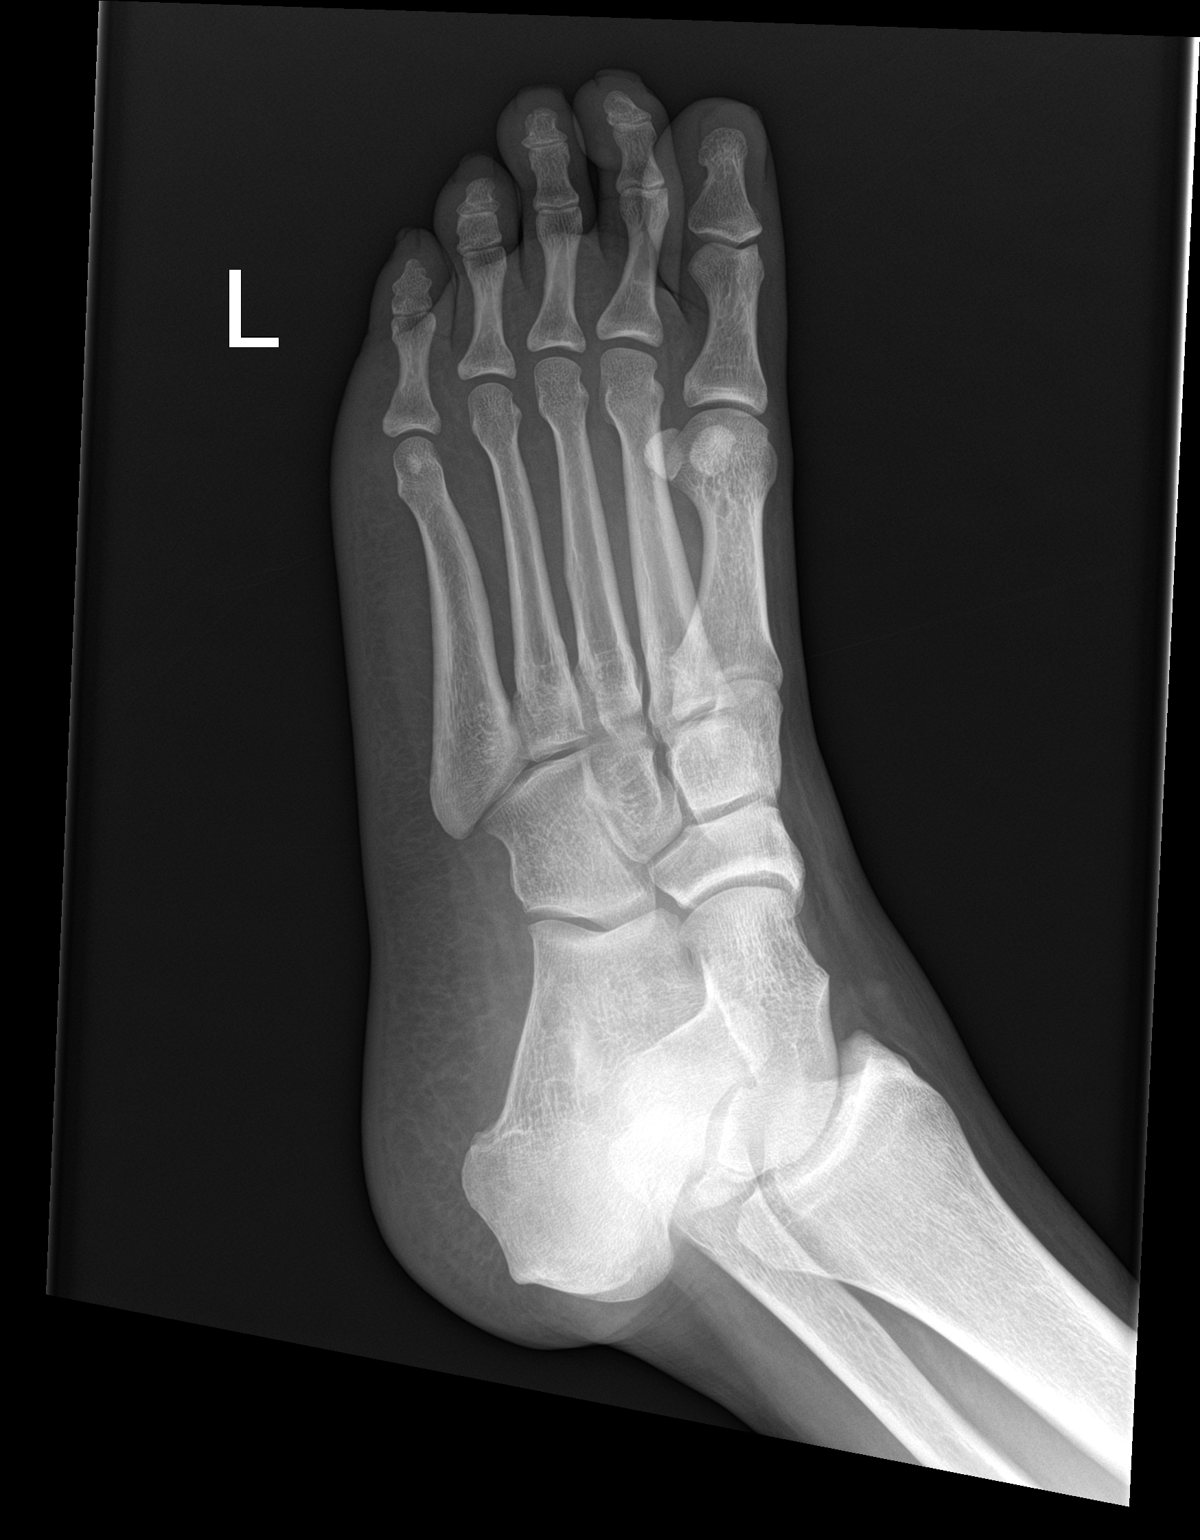

[foot lat]
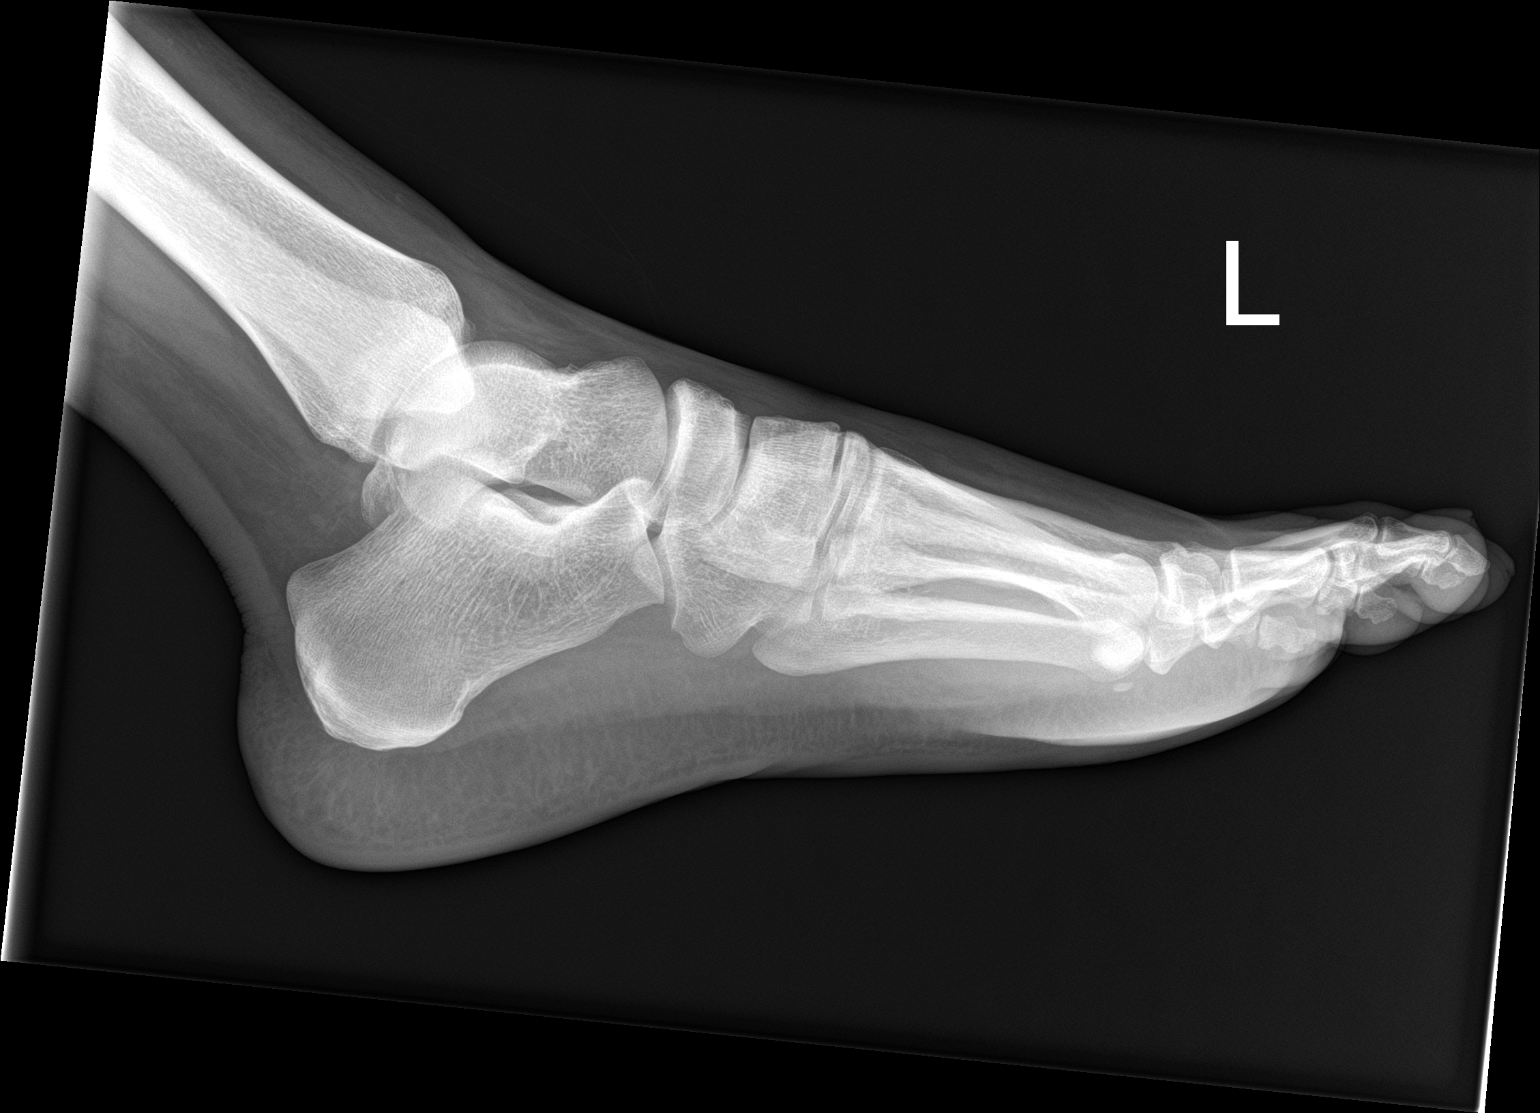

[3 of 3 positions shown; findings below may reference images not displayed]

FINDINGS: Osseous alignment is normal. No fracture line or displaced fracture
fragment seen. Adjacent soft tissues are unremarkable.
IMPRESSION: Negative.
